# Patient Record
Sex: Female | Born: 1950 | Race: White | Hispanic: No | Smoking: Current every day smoker
Health system: Southern US, Community
[De-identification: ages and names within clinical notes are randomized; demographics above are authoritative.]

## PROBLEM LIST (undated history)

## (undated) DIAGNOSIS — J40 Bronchitis, not specified as acute or chronic: Secondary | ICD-10-CM

## (undated) HISTORY — PX: TONSILLECTOMY: SUR1361

---

## 2019-09-13 ENCOUNTER — Emergency Department: Payer: Medicare Other

## 2019-09-13 ENCOUNTER — Emergency Department
Admission: EM | Admit: 2019-09-13 | Discharge: 2019-09-14 | Disposition: A | Discharge status: Home / Self Care | Payer: Medicare Other | Attending: Student | Admitting: Student

## 2019-09-13 ENCOUNTER — Encounter: Payer: Self-pay | Admitting: Emergency Medicine

## 2019-09-13 ENCOUNTER — Other Ambulatory Visit: Payer: Self-pay

## 2019-09-13 DIAGNOSIS — R519 Headache, unspecified: Secondary | ICD-10-CM | POA: Diagnosis not present

## 2019-09-13 DIAGNOSIS — H538 Other visual disturbances: Secondary | ICD-10-CM | POA: Diagnosis not present

## 2019-09-13 DIAGNOSIS — F17200 Nicotine dependence, unspecified, uncomplicated: Secondary | ICD-10-CM | POA: Insufficient documentation

## 2019-09-13 DIAGNOSIS — H532 Diplopia: Secondary | ICD-10-CM | POA: Diagnosis not present

## 2019-09-13 LAB — SEDIMENTATION RATE: Sed Rate: 8 mm/hr (ref 0–30)

## 2019-09-13 LAB — CBC WITH DIFFERENTIAL/PLATELET
Abs Immature Granulocytes: 0.01 10*3/uL (ref 0.00–0.07)
Basophils Absolute: 0 10*3/uL (ref 0.0–0.1)
Basophils Relative: 0 %
Eosinophils Absolute: 0.1 10*3/uL (ref 0.0–0.5)
Eosinophils Relative: 2 %
HCT: 45.8 % (ref 36.0–46.0)
Hemoglobin: 15.1 g/dL — ABNORMAL HIGH (ref 12.0–15.0)
Immature Granulocytes: 0 %
Lymphocytes Relative: 15 %
Lymphs Abs: 0.9 10*3/uL (ref 0.7–4.0)
MCH: 26.6 pg (ref 26.0–34.0)
MCHC: 33 g/dL (ref 30.0–36.0)
MCV: 80.8 fL (ref 80.0–100.0)
Monocytes Absolute: 0.3 10*3/uL (ref 0.1–1.0)
Monocytes Relative: 6 %
Neutro Abs: 4.3 10*3/uL (ref 1.7–7.7)
Neutrophils Relative %: 77 %
Platelets: 78 10*3/uL — ABNORMAL LOW (ref 150–400)
RBC: 5.67 MIL/uL — ABNORMAL HIGH (ref 3.87–5.11)
RDW: 15.3 % (ref 11.5–15.5)
WBC: 5.7 10*3/uL (ref 4.0–10.5)
nRBC: 0 % (ref 0.0–0.2)

## 2019-09-13 LAB — COMPREHENSIVE METABOLIC PANEL
ALT: 17 U/L (ref 0–44)
AST: 29 U/L (ref 15–41)
Albumin: 3.9 g/dL (ref 3.5–5.0)
Alkaline Phosphatase: 77 U/L (ref 38–126)
Anion gap: 9 (ref 5–15)
BUN: 8 mg/dL (ref 8–23)
CO2: 27 mmol/L (ref 22–32)
Calcium: 9.1 mg/dL (ref 8.9–10.3)
Chloride: 103 mmol/L (ref 98–111)
Creatinine, Ser: 0.62 mg/dL (ref 0.44–1.00)
GFR calc Af Amer: >60 mL/min (ref >60)
GFR calc non Af Amer: >60 mL/min (ref >60)
Glucose, Bld: 99 mg/dL (ref 70–99)
Potassium: 3.7 mmol/L (ref 3.5–5.1)
Sodium: 139 mmol/L (ref 135–145)
Total Bilirubin: 1.6 mg/dL — ABNORMAL HIGH (ref 0.3–1.2)
Total Protein: 8 g/dL (ref 6.5–8.1)

## 2019-09-13 LAB — HEMOGLOBIN A1C
Hgb A1c MFr Bld: 5.7 % — ABNORMAL HIGH (ref 4.8–5.6)
Mean Plasma Glucose: 116.89 mg/dL

## 2019-09-13 LAB — C-REACTIVE PROTEIN: CRP: 0.6 mg/dL (ref <1.0)

## 2019-09-13 MED ORDER — IOHEXOL 300 MG/ML  SOLN
75.0000 mL | Freq: Once | INTRAMUSCULAR | Status: AC | PRN
  Administered 2019-09-13: 75 mL via INTRAVENOUS

## 2019-09-13 NOTE — ED Notes (Signed)
See triage note. Pt reports double-vision at times in L eye and HAs. Pt currently has glasses on. Pt states it has been a slow occurring process.

## 2019-09-13 NOTE — ED Provider Notes (Signed)
[facility] Emergency Department Provider Note  ____________________________________________   First MD Initiated Contact with Patient 09/13/19 2300     (approximate)  I have reviewed the triage vital signs and the nursing notes.  History  Chief Complaint Diplopia and Headache    HPI Joan Roman is a 69 y.o. female with no significant past medical history who presents for double vision.  Patient states symptoms seemed to start acutely, out of nowhere, about 3 weeks ago.  She was seen in the [facility] today and referred to the ED for further evaluation, including blood work and imaging.  Blurry vision and double vision seems isolated to the left eye.  When she covers her left eye and looks only with her right eye she reports improvement in her symptoms.  She describes generally blurry vision out of the left, but more so is bothered by double vision.  This seems to occur on left lateral gaze, she sees objects side-by-side.  However, she states this does not occur all the time, and not with all objects.  She was given a prism at the eye center which she states is improving her double vision.  She denies any eye pain.  Wears bifocal glasses at baseline.  Reports a mild associated headache, which she attributes to the blurred vision.  Headache improves when the double vision improves and with rest.  Denies any history of TIA or CVA.  Denies any history of VTE.  No associated weakness, numbness, tingling, or speech difficulties.  Denies any particular inciting event prior to the onset of her symptoms.   Past Medical Hx History reviewed. No pertinent past medical history.  Problem List There are no problems to display for this patient.   Past Surgical Hx History reviewed. No pertinent surgical history.  Medications Prior to Admission medications   Not on File    Allergies Patient has no known allergies.  Family Hx No family history on  file.  Social Hx Social History   Tobacco Use  . Smoking status: Current Every Day Smoker  . Smokeless tobacco: Never Used  Substance Use Topics  . Alcohol use: Not Currently  . Drug use: Not Currently     Review of Systems  Constitutional: Negative for fever. Negative for chills. Eyes: + for visual changes. ENT: Negative for sore throat. Cardiovascular: Negative for chest pain. Respiratory: Negative for shortness of breath. Gastrointestinal: Negative for nausea. Negative for vomiting.  Genitourinary: Negative for dysuria. Musculoskeletal: Negative for leg swelling. Skin: Negative for rash. Neurological: + for headaches.   Physical Exam  Vital Signs: ED Triage Vitals  Enc Vitals Group     BP 09/13/19 1706 134/66     Pulse Rate 09/13/19 1706 82     Resp 09/13/19 1706 16     Temp 09/13/19 1706 98.7 F (37.1 C)     Temp Source 09/13/19 1706 Oral     SpO2 09/13/19 1706 96 %     Weight 09/13/19 1710 260 lb (117.9 kg)     Height 09/13/19 1710 5\' 4"  (1.626 m)     Head Circumference --      Peak Flow --      Pain Score 09/13/19 1709 0     Pain Loc --      Pain Edu? --      Excl. in [place]? --     Constitutional: Alert and oriented. Well appearing. NAD.  Head: Normocephalic. Atraumatic. Eyes: Conjunctivae clear. Sclera anicteric. Pupils dilated  by ophthalmology prior to arrival - visual acuity deferred 2/2 this. Full EOMI without pain or discomfort, but does seem to have very subtle limitation on far left lateral gaze. On vertical upward gaze her R eye seems to drift medially, unsure if this is new or chronic. Visual fields in tact. No diplopia with prism in place. No nystagmus.  Nose: No masses or lesions. No congestion or rhinorrhea. Mouth/Throat: Wearing mask.  Neck: No stridor. Trachea midline.  Cardiovascular: Normal rate, regular rhythm. Extremities well perfused. Respiratory: Normal respiratory effort.  Lungs CTAB. Gastrointestinal: Soft. Non-distended. Non-tender.   Genitourinary: Deferred. Musculoskeletal: No lower extremity edema. No deformities. Neurologic:  Normal speech and language. No gross focal or lateralizing neurologic deficits are appreciated. CN II - XII in tact. No facial droop. UE and LE strength 5/5 and symmetric. SILT. Skin: Skin is warm, dry and intact. No rash noted. Psychiatric: Mood and affect are appropriate for situation.  EKG  N/A   Radiology  Personally reviewed available imaging myself.   CT head w/o IMPRESSION:  1. No acute abnormality.  2. Mild diffuse cerebral and cerebellar atrophy.  3. 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   CT orbits w/ contrast IMPRESSION:  1. No acute abnormality.  2. Mild chronic bilateral maxillary sinusitis.   MR Brain w/o IMPRESSION:  1. No acute intracranial abnormality.  2. Normal intracranial MRA.  3. Single focus of chronic microhemorrhage in the right parietal  white matter, nonspecific.   MRA head IMPRESSION:  1. No acute intracranial abnormality.  2. Normal intracranial MRA.  3. Single focus of chronic microhemorrhage in the right parietal  white matter, nonspecific.   MRV IMPRESSION:  Normal MRV of the brain.   Procedures  Procedure(s) performed (including critical care):  Procedures   Initial Impression / Assessment and Plan / MDM / ED Course  69 y.o. female who presents to the ED for diplopia, as above  Ddx: CN palsy, TIA, mass lesion, cranial nerve lesion, CVT  Work up initiated in triage including labs and CT head and orbits unremarkable. Will obtain MR imaging, incluing MR brain, MRA head, and MRV for further evaluation. Patient is agreeable.   MR imaging is all negative for any acute abnormalities.  Incidentally seen single focus of chronic microhemorrhage.  Updated patient on results, including incidental finding.  Discussed need for follow-up with PCP for this. She voices  understanding of this.  Otherwise, patient is stable for discharge with outpatient follow-up.  Given return precautions.    _______________________________   As part of my medical decision making I have reviewed available labs, radiology tests, reviewed old records/chart review.    Final Clinical Impression(s) / ED Diagnosis  Final diagnoses:  Double vision       Note:  This document was prepared using Dragon voice recognition software and may include unintentional dictation errors.   Miguel Aschoff., MD 09/14/19 [phone]

## 2019-09-13 NOTE — ED Triage Notes (Signed)
Pt to ED via POV from [facility] for new onset double vision and headache. Pt states that her double vision and headaches have been going on for 3 week. Pt states that she thought it was allergies at first but OTC allergies medications have not helped. Pt states that she does not have hx/o diabetes. Pt states that she does occasionally get headaches. Pt is in NAD.

## 2019-09-14 ENCOUNTER — Emergency Department: Payer: Medicare Other

## 2019-09-14 MED ORDER — GADOBUTROL 1 MMOL/ML IV SOLN
10.0000 mL | Freq: Once | INTRAVENOUS | Status: AC | PRN
  Administered 2019-09-14: 10 mL via INTRAVENOUS

## 2019-09-14 NOTE — Discharge Instructions (Addendum)
Thank you for letting us take care of you in the emergency department today.   Please follow up with: A primary care doctor to review your ER visit and follow up on your symptoms. If you do not have a regular doctor, information for a clinic is below. As we discussed, your MRI imaging was negative aside from a small focus of "chronic microhemorrhage" - while this is nothing we need to act on emergently, sometimes it can indicate things like undiagnosed high blood pressure, which should be looked at by your regular doctor The eye doctor   Please return to the ER for any new or worsening symptoms.

## 2021-01-12 ENCOUNTER — Emergency Department: Payer: Medicare Other

## 2021-01-12 ENCOUNTER — Other Ambulatory Visit: Payer: Self-pay

## 2021-01-12 ENCOUNTER — Encounter: Payer: Self-pay | Admitting: Internal Medicine

## 2021-01-12 ENCOUNTER — Inpatient Hospital Stay
Admission: EM | Admit: 2021-01-12 | Discharge: 2021-01-26 | DRG: 432 | Disposition: A | Discharge status: Other Acute Inpatient Hospital | Payer: Medicare Other | Attending: Internal Medicine | Admitting: Internal Medicine

## 2021-01-12 DIAGNOSIS — E66813 Obesity, class 3: Secondary | ICD-10-CM | POA: Diagnosis present

## 2021-01-12 DIAGNOSIS — E876 Hypokalemia: Secondary | ICD-10-CM | POA: Diagnosis present

## 2021-01-12 DIAGNOSIS — N390 Urinary tract infection, site not specified: Secondary | ICD-10-CM | POA: Diagnosis present

## 2021-01-12 DIAGNOSIS — F17210 Nicotine dependence, cigarettes, uncomplicated: Secondary | ICD-10-CM | POA: Diagnosis present

## 2021-01-12 DIAGNOSIS — R0602 Shortness of breath: Secondary | ICD-10-CM

## 2021-01-12 DIAGNOSIS — Z20822 Contact with and (suspected) exposure to covid-19: Secondary | ICD-10-CM | POA: Diagnosis present

## 2021-01-12 DIAGNOSIS — B961 Klebsiella pneumoniae [K. pneumoniae] as the cause of diseases classified elsewhere: Secondary | ICD-10-CM | POA: Diagnosis present

## 2021-01-12 DIAGNOSIS — J90 Pleural effusion, not elsewhere classified: Secondary | ICD-10-CM | POA: Diagnosis present

## 2021-01-12 DIAGNOSIS — K767 Hepatorenal syndrome: Secondary | ICD-10-CM | POA: Diagnosis present

## 2021-01-12 DIAGNOSIS — E559 Vitamin D deficiency, unspecified: Secondary | ICD-10-CM | POA: Diagnosis present

## 2021-01-12 DIAGNOSIS — I9589 Other hypotension: Secondary | ICD-10-CM | POA: Diagnosis not present

## 2021-01-12 DIAGNOSIS — E8809 Other disorders of plasma-protein metabolism, not elsewhere classified: Secondary | ICD-10-CM | POA: Diagnosis present

## 2021-01-12 DIAGNOSIS — D6959 Other secondary thrombocytopenia: Secondary | ICD-10-CM | POA: Diagnosis present

## 2021-01-12 DIAGNOSIS — T502X5A Adverse effect of carbonic-anhydrase inhibitors, benzothiadiazides and other diuretics, initial encounter: Secondary | ICD-10-CM | POA: Diagnosis present

## 2021-01-12 DIAGNOSIS — K7031 Alcoholic cirrhosis of liver with ascites: Secondary | ICD-10-CM | POA: Insufficient documentation

## 2021-01-12 DIAGNOSIS — K766 Portal hypertension: Secondary | ICD-10-CM | POA: Diagnosis present

## 2021-01-12 DIAGNOSIS — R188 Other ascites: Secondary | ICD-10-CM | POA: Diagnosis not present

## 2021-01-12 DIAGNOSIS — I5032 Chronic diastolic (congestive) heart failure: Secondary | ICD-10-CM | POA: Diagnosis present

## 2021-01-12 DIAGNOSIS — Z28310 Unvaccinated for covid-19: Secondary | ICD-10-CM

## 2021-01-12 DIAGNOSIS — Z79899 Other long term (current) drug therapy: Secondary | ICD-10-CM

## 2021-01-12 DIAGNOSIS — K7210 Chronic hepatic failure without coma: Secondary | ICD-10-CM | POA: Diagnosis present

## 2021-01-12 DIAGNOSIS — Z6841 Body Mass Index (BMI) 40.0 and over, adult: Secondary | ICD-10-CM

## 2021-01-12 DIAGNOSIS — K7460 Unspecified cirrhosis of liver: Secondary | ICD-10-CM | POA: Diagnosis not present

## 2021-01-12 DIAGNOSIS — E538 Deficiency of other specified B group vitamins: Secondary | ICD-10-CM | POA: Diagnosis present

## 2021-01-12 DIAGNOSIS — D696 Thrombocytopenia, unspecified: Secondary | ICD-10-CM | POA: Diagnosis present

## 2021-01-12 HISTORY — DX: Bronchitis, not specified as acute or chronic: J40

## 2021-01-12 LAB — BASIC METABOLIC PANEL
Anion gap: 15 (ref 5–15)
BUN: 18 mg/dL (ref 8–23)
CO2: 23 mmol/L (ref 22–32)
Calcium: 8.9 mg/dL (ref 8.9–10.3)
Chloride: 96 mmol/L — ABNORMAL LOW (ref 98–111)
Creatinine, Ser: 1.1 mg/dL — ABNORMAL HIGH (ref 0.44–1.00)
GFR, Estimated: 55 mL/min — ABNORMAL LOW (ref >60)
Glucose, Bld: 67 mg/dL — ABNORMAL LOW (ref 70–99)
Potassium: 4.7 mmol/L (ref 3.5–5.1)
Sodium: 134 mmol/L — ABNORMAL LOW (ref 135–145)

## 2021-01-12 LAB — CBG MONITORING, ED: Glucose-Capillary: 89 mg/dL (ref 70–99)

## 2021-01-12 LAB — COMPREHENSIVE METABOLIC PANEL
ALT: 9 U/L (ref 0–44)
AST: 32 U/L (ref 15–41)
Albumin: 2.9 g/dL — ABNORMAL LOW (ref 3.5–5.0)
Alkaline Phosphatase: 42 U/L (ref 38–126)
Anion gap: 16 — ABNORMAL HIGH (ref 5–15)
BUN: 18 mg/dL (ref 8–23)
CO2: 22 mmol/L (ref 22–32)
Calcium: 8.8 mg/dL — ABNORMAL LOW (ref 8.9–10.3)
Chloride: 97 mmol/L — ABNORMAL LOW (ref 98–111)
Creatinine, Ser: 0.96 mg/dL (ref 0.44–1.00)
GFR, Estimated: >60 mL/min (ref >60)
Glucose, Bld: 63 mg/dL — ABNORMAL LOW (ref 70–99)
Potassium: 4.6 mmol/L (ref 3.5–5.1)
Sodium: 135 mmol/L (ref 135–145)
Total Bilirubin: 2.7 mg/dL — ABNORMAL HIGH (ref 0.3–1.2)
Total Protein: 7.1 g/dL (ref 6.5–8.1)

## 2021-01-12 LAB — BODY FLUID CELL COUNT WITH DIFFERENTIAL
Eos, Fluid: 0 %
Lymphs, Fluid: 72 %
Monocyte-Macrophage-Serous Fluid: 20 %
Neutrophil Count, Fluid: 8 %
Other Cells, Fluid: 0 %
Total Nucleated Cell Count, Fluid: 111 cu mm

## 2021-01-12 LAB — URINALYSIS, COMPLETE (UACMP) WITH MICROSCOPIC
Glucose, UA: NEGATIVE mg/dL
Ketones, ur: 20 mg/dL — AB
Nitrite: NEGATIVE
Protein, ur: 100 mg/dL — AB
Specific Gravity, Urine: 1.027 (ref 1.005–1.030)
WBC, UA: >50 WBC/hpf — ABNORMAL HIGH (ref 0–5)
pH: 6 (ref 5.0–8.0)

## 2021-01-12 LAB — CBC
HCT: 44.4 % (ref 36.0–46.0)
Hemoglobin: 14.2 g/dL (ref 12.0–15.0)
MCH: 26 pg (ref 26.0–34.0)
MCHC: 32 g/dL (ref 30.0–36.0)
MCV: 81.3 fL (ref 80.0–100.0)
Platelets: 89 10*3/uL — ABNORMAL LOW (ref 150–400)
RBC: 5.46 MIL/uL — ABNORMAL HIGH (ref 3.87–5.11)
RDW: 17.9 % — ABNORMAL HIGH (ref 11.5–15.5)
WBC: 6 10*3/uL (ref 4.0–10.5)
nRBC: 0 % (ref 0.0–0.2)

## 2021-01-12 LAB — RESP PANEL BY RT-PCR (FLU A&B, COVID) ARPGX2
Influenza A by PCR: NEGATIVE
Influenza B by PCR: NEGATIVE
SARS Coronavirus 2 by RT PCR: NEGATIVE

## 2021-01-12 LAB — GLUCOSE, CAPILLARY: Glucose-Capillary: 110 mg/dL — ABNORMAL HIGH (ref 70–99)

## 2021-01-12 LAB — HEMOGLOBIN A1C
Hgb A1c MFr Bld: 4.4 % — ABNORMAL LOW (ref 4.8–5.6)
Mean Plasma Glucose: 79.58 mg/dL

## 2021-01-12 LAB — TSH: TSH: 2.868 u[IU]/mL (ref 0.350–4.500)

## 2021-01-12 MED ORDER — FUROSEMIDE 10 MG/ML IJ SOLN
20.0000 mg | Freq: Once | INTRAMUSCULAR | Status: AC
  Administered 2021-01-12: 20 mg via INTRAVENOUS
  Filled 2021-01-12: qty 4

## 2021-01-12 MED ORDER — INSULIN ASPART 100 UNIT/ML IJ SOLN
0.0000 [IU] | Freq: Three times a day (TID) | INTRAMUSCULAR | Status: DC
  Administered 2021-01-13: 1 [IU] via SUBCUTANEOUS
  Filled 2021-01-12: qty 1

## 2021-01-12 MED ORDER — INSULIN ASPART 100 UNIT/ML IJ SOLN: 0.0000 [IU] | Freq: Every day | INTRAMUSCULAR | Status: DC

## 2021-01-12 MED ORDER — HYDRALAZINE HCL 10 MG PO TABS
10.0000 mg | ORAL_TABLET | Freq: Four times a day (QID) | ORAL | Status: AC | PRN
  Filled 2021-01-12: qty 1

## 2021-01-12 MED ORDER — ONDANSETRON HCL 4 MG PO TABS
4.0000 mg | ORAL_TABLET | Freq: Four times a day (QID) | ORAL | Status: DC | PRN
  Administered 2021-01-14 – 2021-01-23 (×3): 4 mg via ORAL
  Filled 2021-01-12 (×3): qty 1

## 2021-01-12 MED ORDER — ONDANSETRON HCL 4 MG/2ML IJ SOLN
4.0000 mg | Freq: Four times a day (QID) | INTRAMUSCULAR | Status: DC | PRN
  Administered 2021-01-13: 4 mg via INTRAVENOUS
  Filled 2021-01-12: qty 2

## 2021-01-12 MED ORDER — ENOXAPARIN SODIUM 40 MG/0.4ML IJ SOSY: 40.0000 mg | PREFILLED_SYRINGE | Freq: Every day | INTRAMUSCULAR | Status: DC

## 2021-01-12 MED ORDER — DEXTROSE 50 % IV SOLN: 1.0000 | INTRAVENOUS | Status: AC | PRN

## 2021-01-12 MED ORDER — IBUPROFEN 400 MG PO TABS
400.0000 mg | ORAL_TABLET | Freq: Four times a day (QID) | ORAL | Status: DC | PRN
Start: 2021-01-12 — End: 2021-01-17

## 2021-01-12 MED ORDER — ALBUMIN HUMAN 25 % IV SOLN
12.5000 g | INTRAVENOUS | Status: DC | PRN
  Filled 2021-01-12 (×2): qty 50

## 2021-01-12 NOTE — ED Notes (Signed)
Patient transported to CT 

## 2021-01-12 NOTE — ED Triage Notes (Signed)
Pt reports that she has been weak for the last three days, she has not been able to get up out of her chair, has not been able to eat or drink for those days either. She has stomach distension. She has never had fluid pulled of of her abdomen. She reports that her stomach feels tight.

## 2021-01-12 NOTE — H&P (Signed)
History and Physical   Joan Roman:[id number] DOB: September 03, 1950 DOA: 01/12/2021  PCP: Pcp, No  Patient coming from: Home via EMS  I have personally briefly reviewed patient's old medical records in [facility] EMR.  Chief Concern: Weakness and abdominal distention  HPI: Joan Roman is a 71 y.o. female with no prior medical diagnosis presents to the ED  She noticed bilateral lower extremity swelling that started 1.5 months ago. She denies leg pain. She states her knees would hurt if she's on her knees and that would hurt if she put excessive weight on her knees, to crawl on her knees to try to get up.   She reports her abdominal swelling started max one month ago. She states that she has a 'belly anyways' so she did realize anything was off. She noticeed tightening of her bra about 2-3 weeks ago.   She endorses subjective fever, however she didn't take her temperature. She endorses baseline cough, however noticed that the cough is assocaited with abdominal discomfort. She denies nausea and vomiting. She denies chest pain, dysuria. She endorses diarrhea for several weeks, with increased mucus, several times per day.   She reports feeling faint this AM.  She reports increased weakness and was unable to get up from sitting or laying position, prompting her to call EMS for transport to the emergency department for further evaluation.  She denies changes to her diet, medications. She reports she has not been to a doctor for more than 10 years.  She reports she has never been diagnosed with anything other than asthma/bronchitis.  She reports she has not been prescribed any medications.  Past medical history: Bronchitis when she was younger  Surgery history: tonsillectomy at age 63  Family history: no prior family history of liver disease that patient is aware of  Social history: She lives by herself at home. She quit smoking about 1 month ago and smoked about 0.5-1 ppd, she started  at age 70. She reports rare etoh consumption. She reports her last etoh drink was last December 2021. She reports she has never been a heavy etoh user. She denies recreational drug use. 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   Vaccination history: She is not vaccinated for covid 19.  She does not know if she is ever been vaccinated for hepatitis B.  ROS: Constitutional: no weight change, no fever ENT/Mouth: no sore throat, no rhinorrhea Eyes: no eye pain, no vision changes Cardiovascular: no chest pain, + dyspnea,  no edema, no palpitations Respiratory: no cough, no sputum, no wheezing Gastrointestinal: no nausea, no vomiting, no diarrhea, no constipation Genitourinary: no urinary incontinence, no dysuria, no hematuria Musculoskeletal: no arthralgias, no myalgias Skin: no skin lesions, no pruritus, Neuro: + weakness, no loss of consciousness, no syncope Psych: no anxiety, no depression, + decrease appetite Heme/Lymph: no bruising, no bleeding  ED Course: Discussed with emergency medicine provider, patient requiring hospitalization for acute abdominal ascites with no prior history of liver cirrhosis.  Vitals in the emergency department was remarkable for temperature 97.8, respiration rate of 20, heart rate 93, blood pressure 129/77, SPO2 of 95% on room air.  Labs in the emergency department was remarkable for sodium 134, potassium 4.7, chloride 96, bicarb 23, BUN 18, serum creatinine of 1.10, nonfasting blood glucose 67  Emergency medicine provider ordered Lasix 20 mg IV once.  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  Approximately 2.5 L were removed at bedside, straw-colored, yellow, clear and negative for purulence - A.m. team to consult IR for further drainage if indicated - Albumin 25%, 12.5 g IV as needed for MAP less than 65, 2 doses ordered - As patient does not have fever and/or leukocytosis, low clinical suspicion for SBP at this time - Therefore I have deferred antibiotic initiation pending body fluid cell count and fluid culture  # Pleural effusion-etiology work-up in progress, differentials include chronic liver cirrhosis versus heart failure exacerbation - Please see above for work-up  # Thrombocytopenia-I suspect this is secondary to liver cirrhosis # Elevated T bili - LFTs in the a.m. DVT prophylaxis-I scheduled Lovenox subcutaneous DVT prophylaxis for 01/13/2021 at 10 PM - However pharmacy wanted this to be discontinued - A.m. team to evaluate daily a.m. platelets and if improved, would recommend initiating pharmacologic DVT when it is appropriate  # Low albumin-I suspect this is secondary to chronic liver failure - LFTs in the a.m.  # Regular health maintenance-patient has never had a colonoscopy, mammogram - TOC consulted for PCP need  Chart reviewed.   DVT prophylaxis: TED hose Code Status: full code  Diet: Heart healthy Family Communication: None Disposition Plan: Pending clinical course Consults called: None at this time Admission status: MedSurg, observation, telemetry  Past Medical History:  Diagnosis Date   Bronchitis    Past Surgical History:  Procedure Laterality Date   TONSILLECTOMY     Social History:  reports that she has been smoking. She has never used  smokeless tobacco. She reports that she does not currently use alcohol. She reports that she does not currently use drugs.  No Known Allergies History reviewed. No pertinent family history. Family history: Family history reviewed and not pertinent  Prior to Admission medications   Not on File   Physical Exam: Vitals:   01/12/21 1650 01/12/21 1800 01/12/21 1830 01/12/21 1900  BP: 117/71 121/84 123/77 134/85  Pulse: 88 93 85 92  Resp: 18 19  17   Temp:    98 F (36.7 C)  TempSrc:    Oral  SpO2: 98% 95% 98% 96%  Weight:      Height:       Constitutional: appears age-appropriate, NAD, calm, comfortable Eyes: PERRL, lids and conjunctivae normal ENMT: Mucous membranes are moist. Posterior pharynx clear of any exudate or lesions. Age-appropriate dentition. Hearing appropriate Neck: normal, supple, no masses, no thyromegaly Respiratory: clear to auscultation bilaterally, no wheezing, no crackles. Normal respiratory effort. No accessory muscle use.  Cardiovascular: Regular rate and rhythm, no murmurs / rubs / gallops.  Bilateral 2+ to 3+ pitting edema of the lower extremities. 2+ pedal pulses. No carotid bruits.  Abdomen: Distended abdomen, no tenderness, no masses palpated, no hepatosplenomegaly. Bowel sounds positive.  Musculoskeletal: no clubbing / cyanosis. No joint deformity upper and lower extremities. Good ROM, no contractures, no atrophy. Normal muscle tone.  Skin: no rashes, lesions,  ulcers. No induration Neurologic: Sensation intact. Strength 5/5 in all 4.  Psychiatric: Normal judgment and insight. Alert and oriented x 3. Normal mood.   EKG: independently reviewed, showing sinus rhythm with rate of 93, QTc 455  Chest x-ray on Admission: I personally reviewed and I agree with radiologist reading as below.  CT Abdomen Pelvis Wo Contrast  Result Date: 01/12/2021 CLINICAL DATA:  Abdominal distension EXAM: CT ABDOMEN AND PELVIS WITHOUT CONTRAST TECHNIQUE: Multidetector CT imaging  of the abdomen and pelvis was performed following the standard protocol without IV contrast. COMPARISON:  None. FINDINGS: Lower chest: Lung bases demonstrate small right-sided pleural effusion. Atelectasis at the right base. Normal cardiac size. Small cardio phrenic lymph nodes. Small distal esophageal hiatal hernia. Hepatobiliary: Cirrhotic morphology of the liver. Multiple calcified gallstones. Subcentimeter hypodensity within the liver too small to further characterize. No biliary dilatation. Recanalized paraumbilical vein. Pancreas: Unremarkable. No pancreatic ductal dilatation or surrounding inflammatory changes. Spleen: Enlarged measuring 15.5 cm craniocaudad. Adrenals/Urinary Tract: Adrenal glands are normal. Multiple bilateral kidney stones without hydronephrosis. The bladder is difficult to see due to ascites. Stomach/Bowel: Centralization of the bowel. No bowel distension. No acute bowel wall thickening. Negative appendix. Vascular/Lymphatic: Mild aortic atherosclerosis. No aneurysm. No suspicious nodes. Tortuous portosystemic collaterals in the left upper quadrant. Reproductive: Uterus and bilateral adnexa are unremarkable. Other: Large volume of abdominopelvic ascites.  No free air. Musculoskeletal: No acute or suspicious osseous abnormality IMPRESSION: 1. Liver cirrhosis with portal hypertension. Large quantity of abdominopelvic ascites. 2. No evidence for bowel obstruction 3. Trace right-sided pleural effusion 4. Gallstones 5. Nonobstructing kidney stones Electronically Signed   By: Jasmine Pang M.D.   On: 01/12/2021 17:31    Labs on Admission: I have personally reviewed following labs  CBC: Recent Labs  Lab 01/12/21 1357  WBC 6.0  HGB 14.2  HCT 44.4  MCV 81.3  PLT 89*   Basic Metabolic Panel: Recent Labs  Lab 01/12/21 1357  NA 135  134*  K 4.6  4.7  CL 97*  96*  CO2 22  23  GLUCOSE 63*  67*  BUN 18  18  CREATININE 0.96  1.10*  CALCIUM 8.8*  8.9   GFR: Estimated  Creatinine Clearance: 59.7 mL/min (A) (by C-G formula based on SCr of 1.1 mg/dL (H)).  Liver Function Tests: Recent Labs  Lab 01/12/21 1357  AST 32  ALT 9  ALKPHOS 42  BILITOT 2.7*  PROT 7.1  ALBUMIN 2.9*   Dr. Sedalia Muta [facility]  If 7PM-7AM, please contact overnight-coverage provider If 7AM-7PM, please contact day coverage provider [email or web address]  01/12/2021, 8:44 PM

## 2021-01-12 NOTE — ED Triage Notes (Signed)
Pt in via EMS from home with c/o weakness. Pt with abdominal distention. Pt has not been able to get our of her chair in a few days. Pt does not have a primary MD.

## 2021-01-12 NOTE — ED Notes (Signed)
Pt hypoglycemic on BMP. Pt A&Ox4. Pt provided with juice and meal tray

## 2021-01-13 ENCOUNTER — Inpatient Hospital Stay: Payer: Medicare Other

## 2021-01-13 DIAGNOSIS — T502X5A Adverse effect of carbonic-anhydrase inhibitors, benzothiadiazides and other diuretics, initial encounter: Secondary | ICD-10-CM | POA: Diagnosis present

## 2021-01-13 DIAGNOSIS — N390 Urinary tract infection, site not specified: Secondary | ICD-10-CM | POA: Diagnosis present

## 2021-01-13 DIAGNOSIS — D696 Thrombocytopenia, unspecified: Secondary | ICD-10-CM | POA: Diagnosis not present

## 2021-01-13 DIAGNOSIS — B961 Klebsiella pneumoniae [K. pneumoniae] as the cause of diseases classified elsewhere: Secondary | ICD-10-CM | POA: Diagnosis present

## 2021-01-13 DIAGNOSIS — E538 Deficiency of other specified B group vitamins: Secondary | ICD-10-CM | POA: Diagnosis present

## 2021-01-13 DIAGNOSIS — Z79899 Other long term (current) drug therapy: Secondary | ICD-10-CM | POA: Diagnosis not present

## 2021-01-13 DIAGNOSIS — E559 Vitamin D deficiency, unspecified: Secondary | ICD-10-CM | POA: Diagnosis present

## 2021-01-13 DIAGNOSIS — I9589 Other hypotension: Secondary | ICD-10-CM | POA: Diagnosis not present

## 2021-01-13 DIAGNOSIS — Z28310 Unvaccinated for covid-19: Secondary | ICD-10-CM | POA: Diagnosis not present

## 2021-01-13 DIAGNOSIS — K7210 Chronic hepatic failure without coma: Secondary | ICD-10-CM | POA: Diagnosis present

## 2021-01-13 DIAGNOSIS — Z6841 Body Mass Index (BMI) 40.0 and over, adult: Secondary | ICD-10-CM | POA: Diagnosis not present

## 2021-01-13 DIAGNOSIS — I5032 Chronic diastolic (congestive) heart failure: Secondary | ICD-10-CM | POA: Diagnosis present

## 2021-01-13 DIAGNOSIS — K766 Portal hypertension: Secondary | ICD-10-CM | POA: Diagnosis present

## 2021-01-13 DIAGNOSIS — J90 Pleural effusion, not elsewhere classified: Secondary | ICD-10-CM | POA: Diagnosis not present

## 2021-01-13 DIAGNOSIS — E8809 Other disorders of plasma-protein metabolism, not elsewhere classified: Secondary | ICD-10-CM | POA: Diagnosis present

## 2021-01-13 DIAGNOSIS — E876 Hypokalemia: Secondary | ICD-10-CM | POA: Diagnosis present

## 2021-01-13 DIAGNOSIS — K767 Hepatorenal syndrome: Secondary | ICD-10-CM | POA: Diagnosis present

## 2021-01-13 DIAGNOSIS — D6959 Other secondary thrombocytopenia: Secondary | ICD-10-CM | POA: Diagnosis present

## 2021-01-13 DIAGNOSIS — K7460 Unspecified cirrhosis of liver: Secondary | ICD-10-CM | POA: Diagnosis present

## 2021-01-13 DIAGNOSIS — Z20822 Contact with and (suspected) exposure to covid-19: Secondary | ICD-10-CM | POA: Diagnosis present

## 2021-01-13 DIAGNOSIS — R188 Other ascites: Secondary | ICD-10-CM | POA: Diagnosis present

## 2021-01-13 DIAGNOSIS — F17210 Nicotine dependence, cigarettes, uncomplicated: Secondary | ICD-10-CM | POA: Diagnosis present

## 2021-01-13 LAB — CBC
HCT: 38.8 % (ref 36.0–46.0)
Hemoglobin: 12.8 g/dL (ref 12.0–15.0)
MCH: 26.4 pg (ref 26.0–34.0)
MCHC: 33 g/dL (ref 30.0–36.0)
MCV: 80 fL (ref 80.0–100.0)
Platelets: 80 10*3/uL — ABNORMAL LOW (ref 150–400)
RBC: 4.85 MIL/uL (ref 3.87–5.11)
RDW: 17.7 % — ABNORMAL HIGH (ref 11.5–15.5)
WBC: 4.2 10*3/uL (ref 4.0–10.5)
nRBC: 0 % (ref 0.0–0.2)

## 2021-01-13 LAB — HEPATITIS PANEL, ACUTE
HCV Ab: NONREACTIVE
Hep A IgM: NONREACTIVE
Hep B C IgM: NONREACTIVE
Hepatitis B Surface Ag: NONREACTIVE

## 2021-01-13 LAB — BASIC METABOLIC PANEL
Anion gap: 11 (ref 5–15)
BUN: 19 mg/dL (ref 8–23)
CO2: 27 mmol/L (ref 22–32)
Calcium: 8.4 mg/dL — ABNORMAL LOW (ref 8.9–10.3)
Chloride: 97 mmol/L — ABNORMAL LOW (ref 98–111)
Creatinine, Ser: 0.93 mg/dL (ref 0.44–1.00)
GFR, Estimated: >60 mL/min (ref >60)
Glucose, Bld: 84 mg/dL (ref 70–99)
Potassium: 4.2 mmol/L (ref 3.5–5.1)
Sodium: 135 mmol/L (ref 135–145)

## 2021-01-13 LAB — PHOSPHORUS: Phosphorus: 2.7 mg/dL (ref 2.5–4.6)

## 2021-01-13 LAB — FOLATE: Folate: 4.2 ng/mL — ABNORMAL LOW (ref >5.9)

## 2021-01-13 LAB — HEPATIC FUNCTION PANEL
ALT: 9 U/L (ref 0–44)
AST: 25 U/L (ref 15–41)
Albumin: 2.6 g/dL — ABNORMAL LOW (ref 3.5–5.0)
Alkaline Phosphatase: 41 U/L (ref 38–126)
Bilirubin, Direct: 0.5 mg/dL — ABNORMAL HIGH (ref 0.0–0.2)
Indirect Bilirubin: 1.4 mg/dL — ABNORMAL HIGH (ref 0.3–0.9)
Total Bilirubin: 1.9 mg/dL — ABNORMAL HIGH (ref 0.3–1.2)
Total Protein: 6 g/dL — ABNORMAL LOW (ref 6.5–8.1)

## 2021-01-13 LAB — HIV ANTIBODY (ROUTINE TESTING W REFLEX): HIV Screen 4th Generation wRfx: NONREACTIVE

## 2021-01-13 LAB — IRON AND TIBC
Iron: 37 ug/dL (ref 28–170)
Saturation Ratios: 16 % (ref 10.4–31.8)
TIBC: 238 ug/dL — ABNORMAL LOW (ref 250–450)
UIBC: 201 ug/dL

## 2021-01-13 LAB — AMMONIA: Ammonia: 21 umol/L (ref 9–35)

## 2021-01-13 LAB — VITAMIN D 25 HYDROXY (VIT D DEFICIENCY, FRACTURES): Vit D, 25-Hydroxy: 9.47 ng/mL — ABNORMAL LOW (ref 30–100)

## 2021-01-13 LAB — HEPATITIS B CORE ANTIBODY, TOTAL: Hep B Core Total Ab: NONREACTIVE

## 2021-01-13 LAB — MAGNESIUM: Magnesium: 1.9 mg/dL (ref 1.7–2.4)

## 2021-01-13 LAB — GLUCOSE, CAPILLARY
Glucose-Capillary: 73 mg/dL (ref 70–99)
Glucose-Capillary: 112 mg/dL — ABNORMAL HIGH (ref 70–99)
Glucose-Capillary: 122 mg/dL — ABNORMAL HIGH (ref 70–99)
Glucose-Capillary: 115 mg/dL — ABNORMAL HIGH (ref 70–99)

## 2021-01-13 LAB — VITAMIN B12: Vitamin B-12: 337 pg/mL (ref 180–914)

## 2021-01-13 LAB — HEPATITIS C ANTIBODY: HCV Ab: NONREACTIVE

## 2021-01-13 MED ORDER — SIMETHICONE 80 MG PO CHEW
80.0000 mg | CHEWABLE_TABLET | Freq: Four times a day (QID) | ORAL | Status: DC | PRN
  Administered 2021-01-13: 20:00:00 80 mg via ORAL
  Filled 2021-01-13 (×3): qty 1

## 2021-01-13 MED ORDER — FOLIC ACID 1 MG PO TABS
1.0000 mg | ORAL_TABLET | Freq: Every day | ORAL | Status: DC
  Administered 2021-01-13 – 2021-01-26 (×14): 1 mg via ORAL
  Filled 2021-01-13 (×16): qty 1

## 2021-01-13 MED ORDER — MIDODRINE HCL 5 MG PO TABS
5.0000 mg | ORAL_TABLET | Freq: Three times a day (TID) | ORAL | Status: DC
  Administered 2021-01-13 – 2021-01-16 (×8): 5 mg via ORAL
  Filled 2021-01-13 (×9): qty 1

## 2021-01-13 MED ORDER — FUROSEMIDE 10 MG/ML IJ SOLN
8.0000 mg/h | INTRAVENOUS | Status: DC
  Administered 2021-01-13 – 2021-01-16 (×4): 8 mg/h via INTRAVENOUS
  Filled 2021-01-13 (×4): qty 20

## 2021-01-13 NOTE — ED Provider Notes (Signed)
[facility] Emergency Department Provider Note   ____________________________________________   Event Date/Time   First MD Initiated Contact with Patient 01/12/21 1644     (approximate)  I have reviewed the triage vital signs and the nursing notes.   HISTORY  Chief Complaint Weakness    HPI Joan Roman is a 70 y.o. female who presents for generalized weakness and abdominal distention  LOCATION: Abdomen/lower extremities DURATION: Worsening over the last month TIMING: Worsening SEVERITY: Severe QUALITY: Generalized weakness and abdominal distention CONTEXT: Patient does not hold any known history of liver disease but has had swelling on her abdomen that is caused her to become very weak in her lower extremities MODIFYING FACTORS: States that sitting for too long causes her lower extremities to go numb ASSOCIATED SYMPTOMS: Nausea and abdominal pain   Per medical record review, patient only has history of bronchitis          Past Medical History:  Diagnosis Date   Bronchitis     Patient Active Problem List   Diagnosis Date Noted   Ascites of liver 01/13/2021   Abdominal ascites 01/12/2021   Obesity, Class III, BMI 40-49.9 (morbid obesity) (HCC) 01/12/2021   Pleural effusion 01/12/2021   Thrombocytopenia (HCC) 01/12/2021   Liver cirrhosis (HCC) 01/12/2021    Past Surgical History:  Procedure Laterality Date   TONSILLECTOMY      Prior to Admission medications   Not on File    Allergies Patient has no known allergies.  History reviewed. No pertinent family history.  Social History Social History   Tobacco Use   Smoking status: Every Day    Types: Cigarettes   Smokeless tobacco: Never  Substance Use Topics   Alcohol use: Not Currently   Drug use: Never    Review of Systems Constitutional: No fever/chills Eyes: No visual changes. ENT: No sore throat. Cardiovascular: Denies chest pain. Respiratory: Denies  shortness of breath. Gastrointestinal: Endorses generalized abdominal pain and distention.  No nausea, no vomiting.  No diarrhea. Genitourinary: Negative for dysuria. Musculoskeletal: Negative for acute arthralgias Skin: Negative for rash. Neurological: Positive for bilateral lower extremity weakness.  Negative for headaches, numbness/paresthesias in any extremity Psychiatric: Negative for suicidal ideation/homicidal ideation   ____________________________________________   PHYSICAL EXAM:  VITAL SIGNS: ED Triage Vitals  Enc Vitals Group     BP 01/12/21 1404 129/77     Pulse Rate 01/12/21 1404 93     Resp 01/12/21 1404 20     Temp 01/12/21 1404 97.8 F (36.6 C)     Temp Source 01/12/21 1404 Oral     SpO2 01/12/21 1404 95 %     Weight 01/12/21 1353 245 lb (111.1 kg)     Height 01/12/21 1353 5\' 4"  (1.626 m)     Head Circumference --      Peak Flow --      Pain Score 01/12/21 1353 0     Pain Loc --      Pain Edu? --      Excl. in GC? --    Constitutional: Alert and oriented. Well appearing and in no acute distress. Eyes: Conjunctivae are normal. PERRL. Head: Atraumatic. Nose: No congestion/rhinnorhea. Mouth/Throat: Mucous membranes are moist. Neck: No stridor Cardiovascular: Grossly normal heart sounds.  Good peripheral circulation. Respiratory: Normal respiratory effort.  No retractions. Gastrointestinal: Extremely distended with positive fluid wave and large volume free fluid on bedside ultrasound Musculoskeletal: No obvious deformities Neurologic:  Normal speech and language. No gross focal neurologic  deficits are appreciated. Skin:  Skin is warm and dry.  Periumbilical telangiectasias and angiomata Psychiatric: Mood and affect are normal. Speech and behavior are normal.  ____________________________________________   LABS (all labs ordered are listed, but only abnormal results are displayed)  Labs Reviewed  BASIC METABOLIC PANEL - Abnormal; Notable for the  following components:      Result Value   Sodium 134 (*)    Chloride 96 (*)    Glucose, Bld 67 (*)    Creatinine, Ser 1.10 (*)    GFR, Estimated 55 (*)    All other components within normal limits  CBC - Abnormal; Notable for the following components:   RBC 5.46 (*)    RDW 17.9 (*)    Platelets 89 (*)    All other components within normal limits  URINALYSIS, COMPLETE (UACMP) WITH MICROSCOPIC - Abnormal; Notable for the following components:   Color, Urine AMBER (*)    APPearance CLOUDY (*)    Hgb urine dipstick MODERATE (*)    Bilirubin Urine SMALL (*)    Ketones, ur 20 (*)    Protein, ur 100 (*)    Leukocytes,Ua LARGE (*)    WBC, UA >50 (*)    Bacteria, UA MANY (*)    All other components within normal limits  COMPREHENSIVE METABOLIC PANEL - Abnormal; Notable for the following components:   Chloride 97 (*)    Glucose, Bld 63 (*)    Calcium 8.8 (*)    Albumin 2.9 (*)    Total Bilirubin 2.7 (*)    Anion gap 16 (*)    All other components within normal limits  BASIC METABOLIC PANEL - Abnormal; Notable for the following components:   Chloride 97 (*)    Calcium 8.4 (*)    All other components within normal limits  CBC - Abnormal; Notable for the following components:   RDW 17.7 (*)    Platelets 80 (*)    All other components within normal limits  BODY FLUID CELL COUNT WITH DIFFERENTIAL - Abnormal; Notable for the following components:   Color, Fluid YELLOW (*)    All other components within normal limits  HEPATIC FUNCTION PANEL - Abnormal; Notable for the following components:   Total Protein 6.0 (*)    Albumin 2.6 (*)    Total Bilirubin 1.9 (*)    Bilirubin, Direct 0.5 (*)    Indirect Bilirubin 1.4 (*)    All other components within normal limits  HEMOGLOBIN A1C - Abnormal; Notable for the following components:   Hgb A1c MFr Bld 4.4 (*)    All other components within normal limits  GLUCOSE, CAPILLARY - Abnormal; Notable for the following components:    Glucose-Capillary 110 (*)    All other components within normal limits  IRON AND TIBC - Abnormal; Notable for the following components:   TIBC 238 (*)    All other components within normal limits  FOLATE - Abnormal; Notable for the following components:   Folate 4.2 (*)    All other components within normal limits  GLUCOSE, CAPILLARY - Abnormal; Notable for the following components:   Glucose-Capillary 112 (*)    All other components within normal limits  RESP PANEL BY RT-PCR (FLU A&B, COVID) ARPGX2  BODY FLUID CULTURE W GRAM STAIN  URINE CULTURE  HIV ANTIBODY (ROUTINE TESTING W REFLEX)  TSH  HEPATITIS PANEL, ACUTE  HEPATITIS C ANTIBODY  HEPATITIS B CORE ANTIBODY, TOTAL  GLUCOSE, CAPILLARY  MAGNESIUM  PHOSPHORUS  AMMONIA  HCV  RNA QUANT  VITAMIN B12  VITAMIN D 25 HYDROXY (VIT D DEFICIENCY, FRACTURES)  CBG MONITORING, ED  CYTOLOGY - NON PAP   ____________________________________________  EKG  ED ECG REPORT I, Merwyn Katos, the attending physician, personally viewed and interpreted this ECG.  Date: 01/13/2021 EKG Time: 1353 Rate: 93 Rhythm: normal sinus rhythm QRS Axis: normal Intervals: normal ST/T Wave abnormalities: normal Narrative Interpretation: no evidence of acute ischemia  ____________________________________________  RADIOLOGY  ED MD interpretation: CT of the abdomen and pelvis without contrast shows liver cirrhosis with portal hypertension and a large amount of abdominal pelvic ascites  Official radiology report(s): CT Abdomen Pelvis Wo Contrast  Result Date: 01/12/2021 CLINICAL DATA:  Abdominal distension EXAM: CT ABDOMEN AND PELVIS WITHOUT CONTRAST TECHNIQUE: Multidetector CT imaging of the abdomen and pelvis was performed following the standard protocol without IV contrast. COMPARISON:  None. FINDINGS: Lower chest: Lung bases demonstrate small right-sided pleural effusion. Atelectasis at the right base. Normal cardiac size. Small cardio phrenic lymph  nodes. Small distal esophageal hiatal hernia. Hepatobiliary: Cirrhotic morphology of the liver. Multiple calcified gallstones. Subcentimeter hypodensity within the liver too small to further characterize. No biliary dilatation. Recanalized paraumbilical vein. Pancreas: Unremarkable. No pancreatic ductal dilatation or surrounding inflammatory changes. Spleen: Enlarged measuring 15.5 cm craniocaudad. Adrenals/Urinary Tract: Adrenal glands are normal. Multiple bilateral kidney stones without hydronephrosis. The bladder is difficult to see due to ascites. Stomach/Bowel: Centralization of the bowel. No bowel distension. No acute bowel wall thickening. Negative appendix. Vascular/Lymphatic: Mild aortic atherosclerosis. No aneurysm. No suspicious nodes. Tortuous portosystemic collaterals in the left upper quadrant. Reproductive: Uterus and bilateral adnexa are unremarkable. Other: Large volume of abdominopelvic ascites.  No free air. Musculoskeletal: No acute or suspicious osseous abnormality IMPRESSION: 1. Liver cirrhosis with portal hypertension. Large quantity of abdominopelvic ascites. 2. No evidence for bowel obstruction 3. Trace right-sided pleural effusion 4. Gallstones 5. Nonobstructing kidney stones Electronically Signed   By: Jasmine Pang M.D.   On: 01/12/2021 17:31   US Paracentesis  Result Date: 01/13/2021 INDICATION: Recurrent ascites request received for paracentesis EXAM: ULTRASOUND GUIDED THERAPEUTIC PARACENTESIS MEDICATIONS: Local 1% lidocaine only COMPLICATIONS: None immediate. PROCEDURE: Informed written consent was obtained from the patient after a discussion of the risks, benefits and alternatives to treatment. A timeout was performed prior to the initiation of the procedure. Initial ultrasound scanning demonstrates a large amount of ascites within the right upper abdominal quadrant. The right upper abdomen was prepped and draped in the usual sterile fashion. 1% lidocaine was used for local  anesthesia. Following this, a 19 gauge, 7-cm, Yueh catheter was introduced. An ultrasound image was saved for documentation purposes. The paracentesis was performed. The catheter was removed and a dressing was applied. The patient tolerated the procedure well without immediate post procedural complication. FINDINGS: A total of approximately 5 L of clear yellow fluid was removed. IMPRESSION: Successful ultrasound-guided paracentesis yielding 5 liters of peritoneal fluid. Read By: Pattricia Boss PA-C Electronically Signed   By: Judie Petit.  Shick M.D.   On: 01/13/2021 15:30    ____________________________________________   PROCEDURES  Procedure(s) performed (including Critical Care):  .1-3 Lead EKG Interpretation  Date/Time: 01/13/2021 4:07 PM Performed by: Merwyn Katos, MD Authorized by: Merwyn Katos, MD     Interpretation: normal     ECG rate:  85   ECG rate assessment: normal     Rhythm: sinus rhythm     Ectopy: none     Conduction: normal   .Paracentesis  Date/Time: 01/13/2021 4:07 PM  Performed by: Merwyn KatosBradler, Raynell Scott K, MD Authorized by: Merwyn KatosBradler, Prima Rayner K, MD   Consent:    Consent obtained:  Verbal   Consent given by:  Patient   Risks, benefits, and alternatives were discussed: yes     Risks discussed:  Bleeding, bowel perforation, infection and pain   Alternatives discussed:  No treatment, delayed treatment and alternative treatment Universal protocol:    Immediately prior to procedure, a time out was called: yes     Patient identity confirmed:  Verbally with patient Pre-procedure details:    Procedure purpose:  Diagnostic Anesthesia:    Anesthesia method:  Local infiltration   Local anesthetic:  Lidocaine 1% w/o epi Procedure details:    Needle gauge:  18   Ultrasound guidance: yes     Puncture site:  R lower quadrant   Fluid removed amount:  3.5L   Fluid appearance:  Amber   Dressing:  4x4 sterile gauze and adhesive bandage Post-procedure details:    Procedure completion:   Tolerated well, no immediate complications   ____________________________________________   INITIAL IMPRESSION / ASSESSMENT AND PLAN / ED COURSE  As part of my medical decision making, I reviewed the following data within the electronic medical record, if available:  Nursing notes reviewed and incorporated, Labs reviewed, EKG interpreted, Old chart reviewed, Radiograph reviewed and Notes from prior ED visits reviewed and incorporated     ED Workup: CBC, BMP, LFTs, PT/INR, Ascitic Fluid studies (Cell count with differential, Gram stain, Glucose, Protein)  Given patients history, exam there presentation is most consistent with abdominal pain from ascites volume burden. I have a low suspicion for Spontaneous Bacterial Peritonitis or other abdominal emergency at this time. The patient had a significant amount of ascites in the abdominal cavity necessitating paracentesis.  ED Interventions: Paracentesis  Disposition: As this is a new diagnosis of cirrhosis and may need intermittent paracentesis to drain all this fluid from her abdomen, will admit patient to the internal medicine service for further evaluation and management      ____________________________________________   FINAL CLINICAL IMPRESSION(S) / ED DIAGNOSES  Final diagnoses:  Ascites of liver     ED Discharge Orders     None        Note:  This document was prepared using Dragon voice recognition software and may include unintentional dictation errors.    Merwyn KatosBradler, Quamesha Mullet K, MD 01/13/21 [phone]

## 2021-01-13 NOTE — Progress Notes (Signed)
[facility] Progress Note  Patient: Joan Roman    CHE:[id number]  DOA: 01/12/2021     Date of Service: the patient was seen and examined on 01/13/2021  Chief Complaint  Patient presents with   Weakness   Brief hospital course: Joan Roman is a 70 y.o. female with no prior medical diagnosis presents to the ED  bilateral lower extremity swelling that started 1.5 months ago, and then gradually her abdominal swelling was getting worse to the point she was unable to ambulate.  She had bloody so she did not realize until it was very tight and also noticed tightening of her bra about 2 to 3 weeks ago.  ED work-up: Labs in the emergency department was remarkable for sodium 134, potassium 4.7, chloride 96, bicarb 23, BUN 18, serum creatinine of 1.10, nonfasting blood glucose 67 Emergency medicine provider ordered Lasix 20 mg IV once     Assessment and Plan: Principal Problem:   Abdominal ascites Active Problems:   Obesity, Class III, BMI 40-49.9 (morbid obesity) (HCC)   Pleural effusion   Thrombocytopenia (HCC)   Liver cirrhosis (HCC)   # Abdominal ascites secondary to liver cirrhosis with portal hypertension - No prior diagnosis of liver cirrhosis, or portal hypertension - Acute hepatitis panel negative,  hepatitis B core antibody negative, hepatitis C antibody negative, TSH , HIV antibody nonreactive hepatitis C RNA quant pending, - Peritoneal fluid culture NGTD and body fluid cell count low, did not show SBP - Complete echo ordered - Strict I's and O's - Emergency medicine provider did bedside paracentesis drain to gravity.  Approximately 2.5 L were removed at bedside, straw-colored, yellow, clear and negative for purulence S/p Albumin 25%, 12.5 g IV as needed for MAP less than 65, 2 doses  Follow IR for therapeutic paracentesis 8/17 started Lasix IV infusion to diurese BP soft, started midodrine 5 mg p.o. 3 times daily with holding parameters to support blood pressure  so that we can diurese Check BMP daily    # Pleural effusion-etiology work-up in progress, differentials include chronic liver cirrhosis versus heart failure exacerbation - Please see above for work-up   # Thrombocytopenia-I suspect this is secondary to liver cirrhosis # Elevated T bili - LFTs in the a.m. DVT prophylaxis-I scheduled Lovenox subcutaneous DVT prophylaxis for 01/13/2021 at 10 PM - However pharmacy wanted this to be discontinued - A.m. team to evaluate daily a.m. platelets and if improved, would recommend initiating pharmacologic DVT when it is appropriate   # Low albumin-I suspect this is secondary to chronic liver failure - LFTs in the a.m.   # Regular health maintenance-patient has never had a colonoscopy, mammogram - TOC consulted for PCP need   #Folate deficiency, folic acid 4.2, started oral supplement.  Follow with PCP to repeat folic acid level after 3 months  # UA shows large LE, many bacteria, WBC > 50 Patient denies any urinary symptoms Follow urine culture  Body mass index is 42.23 kg/m.  Interventions:       Diet: Heart healthy fluid striction 1.5 L/day DVT Prophylaxis: SCD, pharmacological prophylaxis contraindicated due to thrombocytopenia    Advance goals of care discussion: Full code  Family Communication: family was not present at bedside, at the time of interview.  The pt provided permission to discuss medical plan with the family. Opportunity was given to ask question and all questions were answered satisfactorily.   Disposition:  Pt is from Home, admitted with Ascites, still has significant abdominal distention due  to ascites, which precludes a safe discharge. Discharge to TBD, needs PT eval, possible SNF, when clinically improves, may take few days more.  Subjective: No overnight issues, patient was complaining of severe abdominal distention and still feels abdomen is tight, lower extremity edema.  Patient denied any chest pain or  palpitations, patient has shortness of breath secondary to ascites.  Physical Exam: General:  alert oriented to time, place, and person.  Appear in moderate distress, affect appropriate Eyes: PERRLA ENT: Oral Mucosa Clear, moist  Neck: no JVD,  Cardiovascular: S1 and S2 Present, no Murmur,  Respiratory: increased respiratory effort, Bilateral Air entry equal and Decreased, b/l Crackles, no wheezes Abdomen: Extremely distended due to ascites, mild generalized tenderness, difficult to listen BS Skin: No rashes Extremities: 3+ Pedal edema, no calf tenderness Neurologic: without any new focal findings Gait not checked due to patient safety concerns  Vitals:   01/12/21 2111 01/12/21 2349 01/13/21 0406 01/13/21 1210  BP: 114/80 117/70 109/69 110/74  Pulse: 79 84 80 86  Resp: 20 16 16 17   Temp: 98.4 F (36.9 C) 98.9 F (37.2 C) (!) 97.5 F (36.4 C) 98.4 F (36.9 C)  TempSrc: Oral     SpO2: 97% 100% 99% 92%  Weight: 111.6 kg     Height: 5\' 4"  (1.626 m)       Intake/Output Summary (Last 24 hours) at 01/13/2021 1417 Last data filed at 01/13/2021 1015 Gross per 24 hour  Intake 240 ml  Output 200 ml  Net 40 ml   Filed Weights   01/12/21 1353 01/12/21 2111  Weight: 111.1 kg 111.6 kg    Data Reviewed: I have personally reviewed and interpreted daily labs, tele strips, imagings as discussed above. I reviewed all nursing notes, pharmacy notes, vitals, pertinent old records I have discussed plan of care as described above with RN and patient/family.  CBC: Recent Labs  Lab 01/12/21 1357 01/13/21 0621  WBC 6.0 4.2  HGB 14.2 12.8  HCT 44.4 38.8  MCV 81.3 80.0  PLT 89* 80*   Basic Metabolic Panel: Recent Labs  Lab 01/12/21 1357 01/13/21 0621 01/13/21 1031  NA 135  134* 135  --   K 4.6  4.7 4.2  --   CL 97*  96* 97*  --   CO2 22  23 27   --   GLUCOSE 63*  67* 84  --   BUN 18  18 19   --   CREATININE 0.96  1.10* 0.93  --   CALCIUM 8.8*  8.9 8.4*  --   MG  --    --  1.9  PHOS  --   --  2.7    Studies: CT Abdomen Pelvis Wo Contrast  Result Date: 01/12/2021 CLINICAL DATA:  Abdominal distension EXAM: CT ABDOMEN AND PELVIS WITHOUT CONTRAST TECHNIQUE: Multidetector CT imaging of the abdomen and pelvis was performed following the standard protocol without IV contrast. COMPARISON:  None. FINDINGS: Lower chest: Lung bases demonstrate small right-sided pleural effusion. Atelectasis at the right base. Normal cardiac size. Small cardio phrenic lymph nodes. Small distal esophageal hiatal hernia. Hepatobiliary: Cirrhotic morphology of the liver. Multiple calcified gallstones. Subcentimeter hypodensity within the liver too small to further characterize. No biliary dilatation. Recanalized paraumbilical vein. Pancreas: Unremarkable. No pancreatic ductal dilatation or surrounding inflammatory changes. Spleen: Enlarged measuring 15.5 cm craniocaudad. Adrenals/Urinary Tract: Adrenal glands are normal. Multiple bilateral kidney stones without hydronephrosis. The bladder is difficult to see due to ascites. Stomach/Bowel: Centralization of the bowel. No bowel distension.  No acute bowel wall thickening. Negative appendix. Vascular/Lymphatic: Mild aortic atherosclerosis. No aneurysm. No suspicious nodes. Tortuous portosystemic collaterals in the left upper quadrant. Reproductive: Uterus and bilateral adnexa are unremarkable. Other: Large volume of abdominopelvic ascites.  No free air. Musculoskeletal: No acute or suspicious osseous abnormality IMPRESSION: 1. Liver cirrhosis with portal hypertension. Large quantity of abdominopelvic ascites. 2. No evidence for bowel obstruction 3. Trace right-sided pleural effusion 4. Gallstones 5. Nonobstructing kidney stones Electronically Signed   By: Jasmine Pang M.D.   On: 01/12/2021 17:31    Scheduled Meds:  insulin aspart  0-5 Units Subcutaneous QHS   insulin aspart  0-9 Units Subcutaneous TID WC   midodrine  5 mg Oral TID WC   Continuous  Infusions:  albumin human     furosemide (LASIX) 200 mg in dextrose 5% 100 mL (2mg /mL) infusion     PRN Meds: albumin human, dextrose, hydrALAZINE, ibuprofen, ondansetron **OR** ondansetron (ZOFRAN) IV  Time spent: 35 minutes  Author: . MD [facility] 01/13/2021 2:17 PM  To reach On-call, see care teams to locate the attending and reach out to them via [email or web address]. If 7PM-7AM, please contact night-coverage If you still have difficulty reaching the attending provider, please page the [facility] (Director on Call) for [facility] on amion for assistance.

## 2021-01-13 NOTE — Progress Notes (Signed)
Parencentisis

## 2021-01-13 NOTE — Procedures (Signed)
PROCEDURE SUMMARY:  Successful US guided paracentesis from RUQ.  Yielded 5 Liters of clear yellow fluid.  No immediate complications.  Pt tolerated well.   Specimen was not sent for labs.  EBL < 56mL  Cloretta Ned 01/13/2021 3:31 PM

## 2021-01-13 NOTE — TOC Initial Note (Signed)
Transition of Care [facility]) - Initial/Assessment Note    Patient Details  Name: Joan Roman MRN: [id number] Date of Birth: November 25, 1950  Transition of Care [facility]) CM/SW Contact:    Shelbie Hutching, RN Phone Number: 01/13/2021, 3:58 PM  Clinical Narrative:                 Patient admitted to the hospital with liver cirrhosis and portal hypertension.  Patient has no past medical history and has not been to a doctor in 10 years.  RNCM met with patient at the bedside.  Patient lives alone and is normally independent but has recently started having swelling of her abd and weakness, she reports already having swelling in her lower extremities.  Patient admits to going to Urgent care for abd pain and distention in February of this year and they recommended that patient go to the emergency room for further workup.  Patient never went to the ER.  Patient agrees to being set up with new PCP and is okay with [employer]. Appt:  Thursday 8/25 at 1pm scheduled.  Patient drives and can get to her appointments.  Expected Discharge Plan: Home/Self Care Barriers to Discharge: Continued Medical Work up   Patient Goals and CMS Choice Patient states their goals for this hospitalization and ongoing recovery are:: Patient wants to get better and be able to do the things she was doing      Expected Discharge Plan and Services Expected Discharge Plan: Home/Self Care   Discharge Planning Services: CM Consult   Living arrangements for the past 2 months: Single Family Home                 DME Arranged: N/A DME Agency: NA                  Prior Living Arrangements/Services Living arrangements for the past 2 months: Single Family Home Lives with:: Self Patient language and need for interpreter reviewed:: Yes Do you feel safe going back to the place where you live?: Yes      Need for Family Participation in Patient Care: Yes (Comment) (liver cirrhosis) Care giver support system  in place?: Yes (comment) (brothers)   Criminal Activity/Legal Involvement Pertinent to Current Situation/Hospitalization: No - Comment as needed  Activities of Daily Living Home Assistive Devices/Equipment: None ADL Screening (condition at time of admission) Patient's cognitive ability adequate to safely complete daily activities?: Yes Is the patient deaf or have difficulty hearing?: No Does the patient have difficulty seeing, even when wearing glasses/contacts?: No Does the patient have difficulty concentrating, remembering, or making decisions?: No Patient able to express need for assistance with ADLs?: Yes Does the patient have difficulty dressing or bathing?: Yes Independently performs ADLs?: No Communication: Independent Dressing (OT): Needs assistance Is this a change from baseline?: Change from baseline, expected to last <3days Grooming: Needs assistance Is this a change from baseline?: Change from baseline, expected to last <3 days Feeding: Independent Bathing: Needs assistance Is this a change from baseline?: Change from baseline, expected to last <3 days Toileting: Needs assistance Is this a change from baseline?: Change from baseline, expected to last <3 days In/Out Bed: Dependent Is this a change from baseline?: Change from baseline, expected to last <3 days Walks in Home: Dependent Is this a change from baseline?: Change from baseline, expected to last <3 days Does the patient have difficulty walking or climbing stairs?: Yes Weakness of Legs: Both Weakness of Arms/Hands: Both  Permission Sought/Granted  Permission sought to share information with : Case Manager, Family Supports Permission granted to share information with : Yes, Verbal Permission Granted  Share Information with NAME: Elta Guadel[place]     Permission granted to share info w Relationship: brother     Emotional Assessment Appearance:: Appears stated age Attitude/Demeanor/Rapport: Engaged Affect (typically  observed): Accepting Orientation: : Oriented to Self, Oriented to Place, Oriented to  Time, Oriented to Situation Alcohol / Substance Use: Not Applicable Psych Involvement: No (comment)  Admission diagnosis:  Abdominal ascites [R18.8] Ascites of liver [R18.8] Patient Active Problem List   Diagnosis Date Noted   Ascites of liver 01/13/2021   Abdominal ascites 01/12/2021   Obesity, Class III, BMI 40-49.9 (morbid obesity) ([place]) 01/12/2021   Pleural effusion 01/12/2021   Thrombocytopenia ([place]) 01/12/2021   Liver cirrhosis ([place]) 01/12/2021   PCP:  Pcp, No Pharmacy:  No Pharmacies Listed    Social Determinants of Health (SDOH) Interventions    Readmission Risk Interventions No flowsheet data found.

## 2021-01-13 NOTE — Plan of Care (Signed)
Goals of improvement to skin integrity and medical problems will improve without signs or symptoms of infection.

## 2021-01-14 ENCOUNTER — Inpatient Hospital Stay
Admit: 2021-01-14 | Discharge: 2021-01-14 | Disposition: A | Discharge status: Home / Self Care | Payer: Medicare Other | Attending: Internal Medicine | Admitting: Internal Medicine

## 2021-01-14 DIAGNOSIS — R188 Other ascites: Secondary | ICD-10-CM | POA: Diagnosis not present

## 2021-01-14 LAB — BASIC METABOLIC PANEL
Anion gap: 10 (ref 5–15)
BUN: 19 mg/dL (ref 8–23)
CO2: 29 mmol/L (ref 22–32)
Calcium: 8.2 mg/dL — ABNORMAL LOW (ref 8.9–10.3)
Chloride: 97 mmol/L — ABNORMAL LOW (ref 98–111)
Creatinine, Ser: 1.15 mg/dL — ABNORMAL HIGH (ref 0.44–1.00)
GFR, Estimated: 52 mL/min — ABNORMAL LOW (ref >60)
Glucose, Bld: 120 mg/dL — ABNORMAL HIGH (ref 70–99)
Potassium: 3.3 mmol/L — ABNORMAL LOW (ref 3.5–5.1)
Sodium: 136 mmol/L (ref 135–145)

## 2021-01-14 LAB — CBC
HCT: 41.4 % (ref 36.0–46.0)
Hemoglobin: 13.5 g/dL (ref 12.0–15.0)
MCH: 25.6 pg — ABNORMAL LOW (ref 26.0–34.0)
MCHC: 32.6 g/dL (ref 30.0–36.0)
MCV: 78.4 fL — ABNORMAL LOW (ref 80.0–100.0)
Platelets: 76 10*3/uL — ABNORMAL LOW (ref 150–400)
RBC: 5.28 MIL/uL — ABNORMAL HIGH (ref 3.87–5.11)
RDW: 17.6 % — ABNORMAL HIGH (ref 11.5–15.5)
WBC: 5.4 10*3/uL (ref 4.0–10.5)
nRBC: 0 % (ref 0.0–0.2)

## 2021-01-14 LAB — ECHOCARDIOGRAM COMPLETE
AR max vel: 2.9 cm2
AV Area VTI: 2.91 cm2
AV Area mean vel: 2.75 cm2
AV Mean grad: 4 mmHg
AV Peak grad: 6.7 mmHg
Ao pk vel: 1.29 m/s
Area-P 1/2: 3.01 cm2
Height: 64 in
S' Lateral: 2.68 cm
Weight: 3936.53 oz

## 2021-01-14 LAB — HEPATIC FUNCTION PANEL
ALT: 9 U/L (ref 0–44)
AST: 24 U/L (ref 15–41)
Albumin: 2.6 g/dL — ABNORMAL LOW (ref 3.5–5.0)
Alkaline Phosphatase: 39 U/L (ref 38–126)
Bilirubin, Direct: 0.4 mg/dL — ABNORMAL HIGH (ref 0.0–0.2)
Indirect Bilirubin: 1.2 mg/dL — ABNORMAL HIGH (ref 0.3–0.9)
Total Bilirubin: 1.6 mg/dL — ABNORMAL HIGH (ref 0.3–1.2)
Total Protein: 5.7 g/dL — ABNORMAL LOW (ref 6.5–8.1)

## 2021-01-14 LAB — MAGNESIUM: Magnesium: 1.7 mg/dL (ref 1.7–2.4)

## 2021-01-14 LAB — GLUCOSE, CAPILLARY
Glucose-Capillary: 112 mg/dL — ABNORMAL HIGH (ref 70–99)
Glucose-Capillary: 93 mg/dL (ref 70–99)
Glucose-Capillary: 94 mg/dL (ref 70–99)

## 2021-01-14 LAB — HCV RNA QUANT: HCV Quantitative: NOT DETECTED IU/mL (ref >50)

## 2021-01-14 LAB — PHOSPHORUS: Phosphorus: 3.4 mg/dL (ref 2.5–4.6)

## 2021-01-14 MED ORDER — POTASSIUM CHLORIDE CRYS ER 20 MEQ PO TBCR
40.0000 meq | EXTENDED_RELEASE_TABLET | Freq: Once | ORAL | Status: AC
  Administered 2021-01-14: 09:00:00 40 meq via ORAL
  Filled 2021-01-14: qty 2

## 2021-01-14 MED ORDER — VITAMIN D (ERGOCALCIFEROL) 1.25 MG (50000 UNIT) PO CAPS
50000.0000 [IU] | ORAL_CAPSULE | ORAL | Status: DC
  Administered 2021-01-14 – 2021-01-21 (×2): 50000 [IU] via ORAL
  Filled 2021-01-14 (×2): qty 1

## 2021-01-14 MED ORDER — SODIUM CHLORIDE 0.9 % IV SOLN
1.0000 g | INTRAVENOUS | Status: DC
  Administered 2021-01-14: 18:00:00 1 g via INTRAVENOUS
  Filled 2021-01-14: qty 1
  Filled 2021-01-14: qty 10

## 2021-01-14 MED ORDER — ALBUMIN HUMAN 25 % IV SOLN
12.5000 g | Freq: Four times a day (QID) | INTRAVENOUS | Status: AC
  Administered 2021-01-14 – 2021-01-15 (×4): 12.5 g via INTRAVENOUS
  Filled 2021-01-14 (×5): qty 50

## 2021-01-14 MED ORDER — CYANOCOBALAMIN 500 MCG PO TABS
500.0000 ug | ORAL_TABLET | Freq: Every day | ORAL | Status: DC
  Administered 2021-01-17 – 2021-01-26 (×10): 500 ug via ORAL
  Filled 2021-01-14 (×11): qty 1

## 2021-01-14 MED ORDER — CYANOCOBALAMIN 1000 MCG/ML IJ SOLN
1000.0000 ug | Freq: Every day | INTRAMUSCULAR | Status: AC
  Administered 2021-01-14 – 2021-01-16 (×3): 1000 ug via INTRAMUSCULAR
  Filled 2021-01-14 (×3): qty 1

## 2021-01-14 MED ORDER — LACTULOSE 10 GM/15ML PO SOLN
10.0000 g | Freq: Every day | ORAL | Status: DC
  Administered 2021-01-14: 10 g via ORAL
  Filled 2021-01-14: qty 30

## 2021-01-14 NOTE — Progress Notes (Signed)
*  PRELIMINARY RESULTS* Echocardiogram 2D Echocardiogram has been performed.  Joan Roman 01/14/2021, 8:54 AM 

## 2021-01-14 NOTE — Plan of Care (Signed)
  Problem: Health Behavior/Discharge Planning: Goal: Ability to manage health-related needs will improve Outcome: Progressing   Problem: Clinical Measurements: Goal: Ability to maintain clinical measurements within normal limits will improve Outcome: Progressing Goal: Will remain free from infection Outcome: Progressing   Problem: Activity: Goal: Risk for activity intolerance will decrease Outcome: Progressing   Problem: Pain Managment: Goal: General experience of comfort will improve Outcome: Progressing   

## 2021-01-14 NOTE — Progress Notes (Signed)
[facility] Progress Note  Patient: Joan Roman    ZOX:[id number]  DOA: 01/12/2021     Date of Service: the patient was seen and examined on 01/14/2021  Chief Complaint  Patient presents with   Weakness   Brief hospital course: Joan Roman is a 70 y.o. female with no prior medical diagnosis presents to the ED  bilateral lower extremity swelling that started 1.5 months ago, and then gradually her abdominal swelling was getting worse to the point she was unable to ambulate.  She had bloody so she did not realize until it was very tight and also noticed tightening of her bra about 2 to 3 weeks ago.  ED work-up: Labs in the emergency department was remarkable for sodium 134, potassium 4.7, chloride 96, bicarb 23, BUN 18, serum creatinine of 1.10, nonfasting blood glucose 67 Emergency medicine provider ordered Lasix 20 mg IV once     Assessment and Plan: Principal Problem:   Abdominal ascites Active Problems:   Obesity, Class III, BMI 40-49.9 (morbid obesity) (HCC)   Pleural effusion   Thrombocytopenia (HCC)   Liver cirrhosis (H[facility])   # Abdominal ascites secondary to liver cirrhosis with portal hypertension - No prior diagnosis of liver cirrhosis, or portal hypertension - Acute hepatitis panel negative,  hepatitis B core antibody negative, hepatitis C antibody negative, TSH , HIV antibody nonreactive hepatitis C RNA quant pending, - Peritoneal fluid culture NGTD and body fluid cell count low, did not show SBP - TTE shows LVEF 65 to 70%, grade 1 diastolic dysfunction, no wall motion abnormality. - Strict I's and O's - Emergency medicine provider did bedside paracentesis drain to gravity.  Approximately 2.5 L were removed at bedside, straw-colored, yellow, clear and negative for purulence S/p Albumin 25%, 12.5 g IV as needed for MAP less than 65, 2 doses  8/17 s/p paracentesis 5 Dr. Quintin AltoFluid was tapped by IR  8/17 started Lasix IV infusion to diurese BP soft, started  midodrine 5 mg p.o. 3 times daily with holding parameters to support blood pressure so that we can diurese 8/18 abdomen 12.5 g every 6 hours the x4 doses ordered Check BMP daily  # Hypokalemia, due to diuresis Potassium repleted, continue to monitor.   # Pleural effusion-etiology work-up in progress, differentials include chronic liver cirrhosis versus heart failure exacerbation - Please see above for work-up   # Thrombocytopenia-I suspect this is secondary to liver cirrhosis # Elevated T bili - LFTs in the a.m. DVT prophylaxis-I scheduled Lovenox subcutaneous DVT prophylaxis for 01/13/2021 at 10 PM - However pharmacy wanted this to be discontinued - A.m. team to evaluate daily a.m. platelets and if improved, would recommend initiating pharmacologic DVT when it is appropriate   # Low albumin-I suspect this is secondary to chronic liver failure - LFTs in the a.m.   # Regular health maintenance-patient has never had a colonoscopy, mammogram - TOC consulted for PCP need   # Folate deficiency, folic acid 4.2, started oral supplement.  Follow with PCP to repeat folic acid level after 3 months  # Vitamin D deficiency, started vitamin D 50,000 units p.o. weekly, follow with PCP to repeat vitamin D level after 3 months  # Vitamin B12 level 337, target >400, started B12 supplement  # UA shows large LE, many bacteria, WBC > 50 Patient denies any urinary symptoms Follow urine culture growing Klebsiella pneumonia 8/18 started ceftriaxone 1 g IV daily, follow sensitivity report   Body mass index is 42.23 kg/m.  Interventions:  Diet: Heart healthy fluid striction 1.5 L/day DVT Prophylaxis: SCD, pharmacological prophylaxis contraindicated due to thrombocytopenia    Advance goals of care discussion: Full code  Family Communication: family was not present at bedside, at the time of interview.  The pt provided permission to discuss medical plan with the family. Opportunity was given  to ask question and all questions were answered satisfactorily.   Disposition:  Pt is from Home, admitted with Ascites, still has significant abdominal distention due to ascites, which precludes a safe discharge. Discharge to TBD, needs PT eval, possible SNF, when clinically improves, may take few days more.  Subjective: No overnight issues, patient still has generalized abdominal soreness, feels little bit less discomfort after paracentesis.  Denies any shortness of breath, no chest pain or palpitations.  Patient had no bowel movement in 2 days, we will start lactulose.   Physical Exam: General:  alert oriented to time, place, and person.  Appear in moderate distress, affect appropriate Eyes: PERRLA ENT: Oral Mucosa Clear, moist  Neck: no JVD,  Cardiovascular: S1 and S2 Present, no Murmur,  Respiratory: increased respiratory effort, Bilateral Air entry equal and Decreased, b/l Crackles, no wheezes Abdomen: Extremely distended due to ascites, mild generalized tenderness, difficult to listen BS Skin: No rashes Extremities: 3+ Pedal edema, no calf tenderness Neurologic: without any new focal findings Gait not checked due to patient safety concerns  Vitals:   01/13/21 2101 01/14/21 0359 01/14/21 0846 01/14/21 1124  BP: 112/71 109/71 (!) 118/59 103/64  Pulse: 90 80 83 79  Resp: 19 20 18 16   Temp: 98 F (36.7 C) 98.1 F (36.7 C) 98 F (36.7 C) 98 F (36.7 C)  TempSrc:   Oral   SpO2: 94% 94% 95% 96%  Weight:      Height:        Intake/Output Summary (Last 24 hours) at 01/14/2021 1547 Last data filed at 01/14/2021 1521 Gross per 24 hour  Intake 247.13 ml  Output 1125 ml  Net -877.87 ml   Filed Weights   01/12/21 1353 01/12/21 2111  Weight: 111.1 kg 111.6 kg    Data Reviewed: I have personally reviewed and interpreted daily labs, tele strips, imagings as discussed above. I reviewed all nursing notes, pharmacy notes, vitals, pertinent old records I have discussed plan of  care as described above with RN and patient/family.  CBC: Recent Labs  Lab 01/12/21 1357 01/13/21 0621 01/14/21 0534  WBC 6.0 4.2 5.4  HGB 14.2 12.8 13.5  HCT 44.4 38.8 41.4  MCV 81.3 80.0 78.4*  PLT 89* 80* 76*   Basic Metabolic Panel: Recent Labs  Lab 01/12/21 1357 01/13/21 0621 01/13/21 1031 01/14/21 0534  NA 135  134* 135  --  136  K 4.6  4.7 4.2  --  3.3*  CL 97*  96* 97*  --  97*  CO2 22  23 27   --  29  GLUCOSE 63*  67* 84  --  120*  BUN 18  18 19   --  19  CREATININE 0.96  1.10* 0.93  --  1.15*  CALCIUM 8.8*  8.9 8.4*  --  8.2*  MG  --   --  1.9 1.7  PHOS  --   --  2.7 3.4    Studies: ECHOCARDIOGRAM COMPLETE  Result Date: 01/14/2021    ECHOCARDIOGRAM REPORT   Patient Name:   Date of Exam: 01/14/2021 Medical Rec #:  01/16/2021        Height:  64.0 in Accession #:    [id number]       Weight:       246.0 lb Date of Birth:  06/04/1950       BSA:          2.136 m Patient Age:    69 years         BP:           118/59 mmHg Patient Gender: F                HR:           83 bpm. Exam Location:  [facility] Procedure: 2D Echo, Cardiac Doppler and Color Doppler Indications:     Dyspnea R06.00  History:         Patient has no prior history of Echocardiogram examinations.                  Bronchitis.  Sonographer:     Cristela Blue Referring Phys:  [id number] AMY N COX Diagnosing Phys: Marcina Millard MD  Sonographer Comments: Image acquisition challenging due to patient body habitus. IMPRESSIONS  1. Left ventricular ejection fraction, by estimation, is 65 to 70%. The left ventricle has normal function. The left ventricle has no regional wall motion abnormalities. Left ventricular diastolic parameters are consistent with Grade I diastolic dysfunction (impaired relaxation).  2. Right ventricular systolic function is normal. The right ventricular size is normal.  3. The mitral valve is normal in structure. Trivial mitral valve regurgitation. No evidence of mitral  stenosis.  4. The aortic valve is normal in structure. Aortic valve regurgitation is not visualized. No aortic stenosis is present.  5. The inferior vena cava is normal in size with greater than 50% respiratory variability, suggesting right atrial pressure of 3 mmHg. FINDINGS  Left Ventricle: Left ventricular ejection fraction, by estimation, is 65 to 70%. The left ventricle has normal function. The left ventricle has no regional wall motion abnormalities. The left ventricular internal cavity size was normal in size. There is  no left ventricular hypertrophy. Left ventricular diastolic parameters are consistent with Grade I diastolic dysfunction (impaired relaxation). Right Ventricle: The right ventricular size is normal. No increase in right ventricular wall thickness. Right ventricular systolic function is normal. Left Atrium: Left atrial size was normal in size. Right Atrium: Right atrial size was normal in size. Pericardium: There is no evidence of pericardial effusion. Mitral Valve: The mitral valve is normal in structure. Trivial mitral valve regurgitation. No evidence of mitral valve stenosis. Tricuspid Valve: The tricuspid valve is normal in structure. Tricuspid valve regurgitation is trivial. No evidence of tricuspid stenosis. Aortic Valve: The aortic valve is normal in structure. Aortic valve regurgitation is not visualized. No aortic stenosis is present. Aortic valve mean gradient measures 4.0 mmHg. Aortic valve peak gradient measures 6.7 mmHg. Aortic valve area, by VTI measures 2.91 cm. Pulmonic Valve: The pulmonic valve was normal in structure. Pulmonic valve regurgitation is not visualized. No evidence of pulmonic stenosis. Aorta: The aortic root is normal in size and structure. Venous: The inferior vena cava is normal in size with greater than 50% respiratory variability, suggesting right atrial pressure of 3 mmHg. IAS/Shunts: No atrial level shunt detected by color flow Doppler.  LEFT VENTRICLE PLAX  2D LVIDd:         4.41 cm  Diastology LVIDs:         2.68 cm  LV e' medial:    4.90 cm/s LV PW:  1.17 cm  LV E/e' medial:  9.4 LV IVS:        0.79 cm  LV e' lateral:   6.20 cm/s LVOT diam:     2.00 cm  LV E/e' lateral: 7.5 LV SV:         68 LV SV Index:   32 LVOT Area:     3.14 cm  RIGHT VENTRICLE RV Basal diam:  3.13 cm RV S prime:     12.40 cm/s TAPSE (M-mode): 3.5 cm LEFT ATRIUM             Index       RIGHT ATRIUM           Index LA diam:        4.30 cm 2.01 cm/m  RA Area:     11.40 cm LA Vol (A2C):   49.9 ml 23.36 ml/m RA Volume:   22.20 ml  10.39 ml/m LA Vol (A4C):   33.4 ml 15.63 ml/m LA Biplane Vol: 43.3 ml 20.27 ml/m  AORTIC VALVE AV Area (Vmax):    2.90 cm AV Area (Vmean):   2.75 cm AV Area (VTI):     2.91 cm AV Vmax:           129.00 cm/s AV Vmean:          95.600 cm/s AV VTI:            0.234 m AV Peak Grad:      6.7 mmHg AV Mean Grad:      4.0 mmHg LVOT Vmax:         119.00 cm/s LVOT Vmean:        83.700 cm/s LVOT VTI:          0.217 m LVOT/AV VTI ratio: 0.93  AORTA Ao Root diam: 3.10 cm MITRAL VALVE               TRICUSPID VALVE MV Area (PHT): 3.01 cm    TR Peak grad:   27.5 mmHg MV Decel Time: 252 msec    TR Vmax:        262.00 cm/s MV E velocity: 46.30 cm/s MV A velocity: 83.10 cm/s  SHUNTS MV E/A ratio:  0.56        Systemic VTI:  0.22 m                            Systemic Diam: 2.00 cm Marcina Millard MD Electronically signed by Marcina Millard MD Signature Date/Time: 01/14/2021/1:20:09 PM    Final     Scheduled Meds:  cyanocobalamin  1,000 mcg Intramuscular Daily   folic acid  1 mg Oral Daily   insulin aspart  0-5 Units Subcutaneous QHS   insulin aspart  0-9 Units Subcutaneous TID WC   midodrine  5 mg Oral TID WC   [START ON 01/17/2021] vitamin B-12  500 mcg Oral Daily   Vitamin D (Ergocalciferol)  50,000 Units Oral Q7 days   Continuous Infusions:  albumin human     albumin human 12.5 g (01/14/21 1516)   furosemide (LASIX) 200 mg in dextrose 5% 100 mL (2mg /mL)  infusion 8 mg/hr (01/14/21 1207)   PRN Meds: albumin human, dextrose, hydrALAZINE, ibuprofen, ondansetron **OR** ondansetron (ZOFRAN) IV, simethicone  Time spent: 35 minutes  Author: 01/16/21. MD [facility] 01/14/2021 3:47 PM  To reach On-call, see care teams to locate the attending and reach out to them via [email or web address]. If 7PM-7AM,  please contact night-coverage If you still have difficulty reaching the attending provider, please page the [facility] (Director on Call) for [facility] on amion for assistance.

## 2021-01-15 ENCOUNTER — Inpatient Hospital Stay: Payer: Medicare Other

## 2021-01-15 DIAGNOSIS — R188 Other ascites: Secondary | ICD-10-CM | POA: Diagnosis not present

## 2021-01-15 LAB — BASIC METABOLIC PANEL
Anion gap: 10 (ref 5–15)
BUN: 18 mg/dL (ref 8–23)
CO2: 31 mmol/L (ref 22–32)
Calcium: 8.3 mg/dL — ABNORMAL LOW (ref 8.9–10.3)
Chloride: 95 mmol/L — ABNORMAL LOW (ref 98–111)
Creatinine, Ser: 1.18 mg/dL — ABNORMAL HIGH (ref 0.44–1.00)
GFR, Estimated: 50 mL/min — ABNORMAL LOW (ref >60)
Glucose, Bld: 95 mg/dL (ref 70–99)
Potassium: 3.2 mmol/L — ABNORMAL LOW (ref 3.5–5.1)
Sodium: 136 mmol/L (ref 135–145)

## 2021-01-15 LAB — CBC
HCT: 38.5 % (ref 36.0–46.0)
Hemoglobin: 12.8 g/dL (ref 12.0–15.0)
MCH: 26.6 pg (ref 26.0–34.0)
MCHC: 33.2 g/dL (ref 30.0–36.0)
MCV: 80 fL (ref 80.0–100.0)
Platelets: 62 10*3/uL — ABNORMAL LOW (ref 150–400)
RBC: 4.81 MIL/uL (ref 3.87–5.11)
RDW: 17.2 % — ABNORMAL HIGH (ref 11.5–15.5)
WBC: 3.9 10*3/uL — ABNORMAL LOW (ref 4.0–10.5)
nRBC: 0 % (ref 0.0–0.2)

## 2021-01-15 LAB — HEPATIC FUNCTION PANEL
ALT: 7 U/L (ref 0–44)
AST: 19 U/L (ref 15–41)
Albumin: 3.3 g/dL — ABNORMAL LOW (ref 3.5–5.0)
Alkaline Phosphatase: 37 U/L — ABNORMAL LOW (ref 38–126)
Bilirubin, Direct: 0.5 mg/dL — ABNORMAL HIGH (ref 0.0–0.2)
Indirect Bilirubin: 1.2 mg/dL — ABNORMAL HIGH (ref 0.3–0.9)
Total Bilirubin: 1.7 mg/dL — ABNORMAL HIGH (ref 0.3–1.2)
Total Protein: 6.1 g/dL — ABNORMAL LOW (ref 6.5–8.1)

## 2021-01-15 LAB — URINE CULTURE: Culture: >=100000 — AB

## 2021-01-15 LAB — GLUCOSE, CAPILLARY
Glucose-Capillary: 85 mg/dL (ref 70–99)
Glucose-Capillary: 120 mg/dL — ABNORMAL HIGH (ref 70–99)
Glucose-Capillary: 89 mg/dL (ref 70–99)

## 2021-01-15 LAB — MAGNESIUM: Magnesium: 1.7 mg/dL (ref 1.7–2.4)

## 2021-01-15 LAB — PHOSPHORUS: Phosphorus: 3.4 mg/dL (ref 2.5–4.6)

## 2021-01-15 MED ORDER — POTASSIUM CHLORIDE CRYS ER 20 MEQ PO TBCR
40.0000 meq | EXTENDED_RELEASE_TABLET | ORAL | Status: AC
  Administered 2021-01-15 (×2): 40 meq via ORAL
  Filled 2021-01-15 (×2): qty 2

## 2021-01-15 MED ORDER — POTASSIUM CHLORIDE 10 MEQ/100ML IV SOLN
10.0000 meq | INTRAVENOUS | Status: DC
  Administered 2021-01-15: 11:00:00 10 meq via INTRAVENOUS
  Filled 2021-01-15: qty 100

## 2021-01-15 MED ORDER — ALBUMIN HUMAN 25 % IV SOLN
12.5000 g | Freq: Four times a day (QID) | INTRAVENOUS | Status: DC
  Administered 2021-01-15 – 2021-01-17 (×7): 12.5 g via INTRAVENOUS
  Filled 2021-01-15 (×9): qty 50

## 2021-01-15 MED ORDER — CEPHALEXIN 250 MG PO CAPS
250.0000 mg | ORAL_CAPSULE | Freq: Four times a day (QID) | ORAL | Status: AC
  Administered 2021-01-15 – 2021-01-18 (×12): 250 mg via ORAL
  Filled 2021-01-15 (×13): qty 1

## 2021-01-15 MED ORDER — POTASSIUM CHLORIDE CRYS ER 20 MEQ PO TBCR
40.0000 meq | EXTENDED_RELEASE_TABLET | Freq: Two times a day (BID) | ORAL | Status: DC
  Administered 2021-01-15: 11:00:00 40 meq via ORAL
  Filled 2021-01-15: qty 2

## 2021-01-15 MED ORDER — POTASSIUM CHLORIDE CRYS ER 20 MEQ PO TBCR: 40.0000 meq | EXTENDED_RELEASE_TABLET | Freq: Once | ORAL | Status: DC

## 2021-01-15 MED ORDER — LACTULOSE 10 GM/15ML PO SOLN
10.0000 g | Freq: Two times a day (BID) | ORAL | Status: DC
  Administered 2021-01-15 – 2021-01-17 (×5): 10 g via ORAL
  Filled 2021-01-15 (×5): qty 30

## 2021-01-15 NOTE — Progress Notes (Signed)
[facility] Progress Note  Patient: Joan Roman    HFW:[id number]  DOA: 01/12/2021     Date of Service: the patient was seen and examined on 01/15/2021  Chief Complaint  Patient presents with   Weakness   Brief hospital course: Joan Roman is a 70 y.o. female with no prior medical diagnosis presents to the ED  bilateral lower extremity swelling that started 1.5 months ago, and then gradually her abdominal swelling was getting worse to the point she was unable to ambulate.  She had bloody so she did not realize until it was very tight and also noticed tightening of her bra about 2 to 3 weeks ago.  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 - Strict I's and O's - Emergency medicine provider did bedside paracentesis drain to gravity.  Approximately 2.5 L were removed at bedside, straw-colored, yellow, clear and negative for purulence S/p Albumin 25%, 12.5 g IV as needed for MAP less than 65, 2 doses  8/17 s/p paracentesis 5L Fluid was tapped by IR  8/17 started Lasix IV infusion to diurese BP soft, started midodrine 5 mg  p.o. 3 times daily with holding parameters to support blood pressure so that we can diurese 8/18 albumin 12.5 g every 6 hours the x4 doses ordered 8/19 albumin 12.5 g every 6 hourly for 2 days 8/19 repeat paracentesis Check BMP daily  # Hypokalemia, due to diuresis Potassium repleted, continue to monitor.   # Pleural effusion-etiology work-up in progress, differentials include chronic liver cirrhosis versus heart failure exacerbation - Please see above for work-up   # Thrombocytopenia-I suspect this is secondary to liver cirrhosis # Elevated T bili - LFTs in the a.m. DVT prophylaxis-I scheduled Lovenox subcutaneous DVT prophylaxis for 01/13/2021 at 10 PM - However pharmacy wanted this to be discontinued - A.m. team to evaluate daily a.m. platelets and if improved, would recommend initiating pharmacologic DVT when it is appropriate   # Low albumin-I suspect this is secondary to chronic liver failure - LFTs in the a.m.   # Regular health maintenance-patient has never had a colonoscopy, mammogram - TOC consulted for PCP need   # Folate deficiency, folic acid 4.2, started oral supplement.  Follow with PCP to repeat folic acid level after 3 months  # Vitamin D deficiency, started vitamin D 50,000 units p.o. weekly, follow with PCP to repeat vitamin D level after 3 months  # Vitamin B12 level 337, target >400, started B12 supplement  # UA shows large LE, many bacteria, WBC > 50 Patient denies any urinary symptoms Follow urine culture growing Klebsiella pneumonia 8/18 s/p Ceftriaxone 1 g IV x one  dose, switch to Keflex to 50 mg p.o. every 6 hourly for 3 days as per pharmacy recommendation   Body mass index is 42.23 kg/m.  Interventions:       Diet: Heart healthy fluid striction 1.5 L/day DVT Prophylaxis: SCD, pharmacological prophylaxis contraindicated due to thrombocytopenia    Advance goals of care discussion: Full code  Family Communication: family was not present at  bedside, at the time of interview.  The pt provided permission to discuss medical plan with the family. Opportunity was given to ask question and all questions were answered satisfactorily.   Disposition:  Pt is from Home, admitted with Ascites, still has significant abdominal distention due to ascites, which precludes a safe discharge. Discharge to TBD, needs PT eval, possible SNF, when clinically improves, may take few days more.  Subjective: No overnight issues, patient still has generalized abdominal soreness, due to abd distention 2/2 ascites,   Denies any shortness of breath, no chest pain or palpitations.  Patient had small BM, increased lactulose BID.   Physical Exam: General:  alert oriented to time, place, and person.  Appear in moderate distress, affect appropriate Eyes: PERRLA ENT: Oral Mucosa Clear, moist  Neck: no JVD,  Cardiovascular: S1 and S2 Present, no Murmur,  Respiratory: increased respiratory effort, Bilateral Air entry equal and Decreased, b/l Crackles, no wheezes Abdomen: Extremely distended due to ascites, mild generalized tenderness, difficult to listen BS Skin: No rashes Extremities: 3+ Pedal edema, no calf tenderness Neurologic: without any new focal findings Gait not checked due to patient safety concerns  Vitals:   01/14/21 2145 01/15/21 0525 01/15/21 0728 01/15/21 1134  BP: 105/69 98/60 94/60  90/64  Pulse: 74 68 68 70  Resp: 16 18 16 18   Temp: 97.9 F (36.6 C) 97.7 F (36.5 C) (!) 97.5 F (36.4 C) 97.8 F (36.6 C)  TempSrc: Oral  Oral Oral  SpO2: 95% 95% 95% 99%  Weight:      Height:        Intake/Output Summary (Last 24 hours) at 01/15/2021 1235 Last data filed at 01/15/2021 1105 Gross per 24 hour  Intake 331.28 ml  Output 2275 ml  Net -1943.72 ml   Filed Weights   01/12/21 1353 01/12/21 2111  Weight: 111.1 kg 111.6 kg    Data Reviewed: I have personally reviewed and interpreted daily labs, tele strips, imagings as discussed above. I  reviewed all nursing notes, pharmacy notes, vitals, pertinent old records I have discussed plan of care as described above with RN and patient/family.  CBC: Recent Labs  Lab 01/12/21 1357 01/13/21 0621 01/14/21 0534 01/15/21 0422  WBC 6.0 4.2 5.4 3.9*  HGB 14.2 12.8 13.5 12.8  HCT 44.4 38.8 41.4 38.5  MCV 81.3 80.0 78.4* 80.0  PLT 89* 80* 76* 62*   Basic Metabolic Panel: Recent Labs  Lab 01/12/21 1357 01/13/21 0621 01/13/21 1031 01/14/21 0534 01/15/21 0422  NA 135  134* 135  --  136 136  K 4.6  4.7 4.2  --  3.3* 3.2*  CL 97*  96* 97*  --  97* 95*  CO2 22  23 27   --  29 31  GLUCOSE 63*  67* 84  --  120* 95  BUN 18  18 19   --  19 18  CREATININE 0.96  1.10* 0.93  --  1.15* 1.18*  CALCIUM 8.8*  8.9 8.4*  --  8.2* 8.3*  MG  --   --  1.9 1.7 1.7  PHOS  --   --  2.7 3.4 3.4    Studies: No results found.  Scheduled Meds:  cephALEXin  250 mg Oral Q6H   cyanocobalamin  1,000 mcg Intramuscular Daily   folic acid  1 mg Oral Daily   insulin aspart  0-5 Units Subcutaneous QHS   insulin aspart  0-9 Units Subcutaneous TID WC   lactulose  10 g Oral BID   midodrine  5 mg Oral TID WC   potassium chloride  40 mEq Oral Q4H   [START ON 01/17/2021] vitamin B-12  500 mcg Oral Daily   Vitamin D (Ergocalciferol)  50,000 Units Oral Q7 days   Continuous Infusions:  albumin human     albumin human     furosemide (LASIX) 200 mg in dextrose 5% 100 mL (2mg /mL) infusion 8 mg/hr (01/14/21 2021)   PRN Meds: albumin human, dextrose, hydrALAZINE, ibuprofen, ondansetron **OR** ondansetron (ZOFRAN) IV, simethicone  Time spent: 35 minutes  Author: 01/16/21. MD [facility] 01/15/2021 12:35 PM  To reach On-call, see care teams to locate the attending and reach out to them via [email or web address]. If 7PM-7AM, please contact night-coverage If you still have difficulty reaching the attending provider, please page the [facility] (Director on Call) for [facility] on amion for  assistance.

## 2021-01-15 NOTE — Procedures (Signed)
Ultrasound-guided therapeutic paracentesis performed yielding 6.8 liters of yellow fluid. No immediate complications. EBL < 2 cc.

## 2021-01-15 NOTE — Care Management Important Message (Signed)
Important Message  Patient Details  Name: Joan Roman MRN: [id number] Date of Birth: 1951/05/29   Medicare Important Message Given:  Yes     [name] 01/15/2021, 11:30 AM

## 2021-01-16 DIAGNOSIS — R188 Other ascites: Secondary | ICD-10-CM | POA: Diagnosis not present

## 2021-01-16 LAB — BODY FLUID CULTURE W GRAM STAIN: Culture: NO GROWTH

## 2021-01-16 LAB — CBC
HCT: 39 % (ref 36.0–46.0)
Hemoglobin: 12.8 g/dL (ref 12.0–15.0)
MCH: 25.7 pg — ABNORMAL LOW (ref 26.0–34.0)
MCHC: 32.8 g/dL (ref 30.0–36.0)
MCV: 78.2 fL — ABNORMAL LOW (ref 80.0–100.0)
Platelets: 65 10*3/uL — ABNORMAL LOW (ref 150–400)
RBC: 4.99 MIL/uL (ref 3.87–5.11)
RDW: 17 % — ABNORMAL HIGH (ref 11.5–15.5)
WBC: 3.9 10*3/uL — ABNORMAL LOW (ref 4.0–10.5)
nRBC: 0 % (ref 0.0–0.2)

## 2021-01-16 LAB — BASIC METABOLIC PANEL
Anion gap: 10 (ref 5–15)
BUN: 16 mg/dL (ref 8–23)
CO2: 32 mmol/L (ref 22–32)
Calcium: 8 mg/dL — ABNORMAL LOW (ref 8.9–10.3)
Chloride: 94 mmol/L — ABNORMAL LOW (ref 98–111)
Creatinine, Ser: 1.08 mg/dL — ABNORMAL HIGH (ref 0.44–1.00)
GFR, Estimated: 56 mL/min — ABNORMAL LOW (ref >60)
Glucose, Bld: 98 mg/dL (ref 70–99)
Potassium: 2.9 mmol/L — ABNORMAL LOW (ref 3.5–5.1)
Sodium: 136 mmol/L (ref 135–145)

## 2021-01-16 LAB — GLUCOSE, CAPILLARY
Glucose-Capillary: 88 mg/dL (ref 70–99)
Glucose-Capillary: 92 mg/dL (ref 70–99)
Glucose-Capillary: 100 mg/dL — ABNORMAL HIGH (ref 70–99)
Glucose-Capillary: 77 mg/dL (ref 70–99)

## 2021-01-16 LAB — HEPATIC FUNCTION PANEL
ALT: 6 U/L (ref 0–44)
AST: 19 U/L (ref 15–41)
Albumin: 2.8 g/dL — ABNORMAL LOW (ref 3.5–5.0)
Alkaline Phosphatase: 35 U/L — ABNORMAL LOW (ref 38–126)
Bilirubin, Direct: 0.4 mg/dL — ABNORMAL HIGH (ref 0.0–0.2)
Indirect Bilirubin: 1.4 mg/dL — ABNORMAL HIGH (ref 0.3–0.9)
Total Bilirubin: 1.8 mg/dL — ABNORMAL HIGH (ref 0.3–1.2)
Total Protein: 5.2 g/dL — ABNORMAL LOW (ref 6.5–8.1)

## 2021-01-16 LAB — MAGNESIUM: Magnesium: 1.5 mg/dL — ABNORMAL LOW (ref 1.7–2.4)

## 2021-01-16 LAB — PHOSPHORUS: Phosphorus: 2.7 mg/dL (ref 2.5–4.6)

## 2021-01-16 MED ORDER — MAGNESIUM SULFATE 2 GM/50ML IV SOLN
2.0000 g | Freq: Once | INTRAVENOUS | Status: AC
  Administered 2021-01-16: 2 g via INTRAVENOUS
  Filled 2021-01-16: qty 50

## 2021-01-16 MED ORDER — POTASSIUM CHLORIDE 10 MEQ/100ML IV SOLN
10.0000 meq | INTRAVENOUS | Status: AC
  Administered 2021-01-16 (×4): 10 meq via INTRAVENOUS
  Filled 2021-01-16 (×3): qty 100

## 2021-01-16 MED ORDER — POTASSIUM CHLORIDE CRYS ER 20 MEQ PO TBCR
40.0000 meq | EXTENDED_RELEASE_TABLET | ORAL | Status: AC
  Administered 2021-01-16 (×2): 40 meq via ORAL
  Filled 2021-01-16 (×2): qty 2

## 2021-01-16 MED ORDER — MIDODRINE HCL 5 MG PO TABS
10.0000 mg | ORAL_TABLET | Freq: Three times a day (TID) | ORAL | Status: DC
  Administered 2021-01-16 – 2021-01-26 (×32): 10 mg via ORAL
  Filled 2021-01-16 (×32): qty 2

## 2021-01-16 MED ORDER — MIDODRINE HCL 5 MG PO TABS
5.0000 mg | ORAL_TABLET | Freq: Once | ORAL | Status: AC
  Administered 2021-01-16: 09:00:00 5 mg via ORAL
  Filled 2021-01-16: qty 1

## 2021-01-16 MED ORDER — MIDODRINE HCL 5 MG PO TABS: 10.0000 mg | ORAL_TABLET | Freq: Three times a day (TID) | ORAL | Status: DC

## 2021-01-16 NOTE — Progress Notes (Signed)
[facility] Progress Note  Patient: Joan Roman    UYQ:[id number]  DOA: 01/12/2021     Date of Service: the patient was seen and examined on 01/16/2021  Chief Complaint  Patient presents with   Weakness   Brief hospital course: Joan Roman is a 70 y.o. female with no prior medical diagnosis presents to the ED  bilateral lower extremity swelling that started 1.5 months ago, and then gradually her abdominal swelling was getting worse to the point she was unable to ambulate.  She had bloody so she did not realize until it was very tight and also noticed tightening of her bra about 2 to 3 weeks ago.  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 - Strict I's and O's - Emergency medicine provider did bedside paracentesis drain to gravity.  Approximately 2.5 L were removed at bedside, straw-colored, yellow, clear and negative for purulence S/p Albumin 25%, 12.5 g IV as needed for MAP less than 65, 2 doses  8/17 s/p paracentesis 5L Fluid was tapped by IR  8/17 started Lasix IV infusion to diurese BP soft, started midodrine 5 mg  p.o. 3 times daily with holding parameters to support blood pressure so that we can diurese 8/18 albumin 12.5 g every 6 hours the x4 doses ordered 8/19 albumin 12.5 g every 6 hourly for 2 days 8/19 repeat paracentesis 6.8 L fluid was tapped 8/20 increased midodrine 10 mg p.o. 3 times daily due to persistent low blood pressure Check BMP daily  # Hypokalemia, due to diuresis Potassium repleted, continue to monitor.   # Pleural effusion-most likely due to chronic liver cirrhosis Patient has grade 1 diastolic HF, LVEF 65 to 70%    # Thrombocytopenia-I suspect this is secondary to liver cirrhosis # Elevated T bili - monitor LFTs and plts count     # Low albumin-I suspect this is secondary to chronic liver failure - LFTs daily    # Regular health maintenance-patient has never had a colonoscopy, mammogram - TOC consulted for PCP need   # Folate deficiency, folic acid 4.2, started oral supplement.  Follow with PCP to repeat folic acid level after 3 months  # Vitamin D deficiency, started vitamin D 50,000 units p.o. weekly, follow with PCP to repeat vitamin D level after 3 months  # Vitamin B12 level 337, target >400, started B12 supplement  # UA shows large LE, many bacteria, WBC > 50 Patient denies any urinary symptoms Follow urine culture growing Klebsiella pneumonia 8/18 s/p Ceftriaxone 1 g IV x one dose, switch to Keflex to 50 mg p.o. every 6 hourly for 3 days as per pharmacy recommendation  Body mass index is 42.23 kg/m.  Interventions:       Diet: Heart healthy fluid striction 1.5 L/day DVT Prophylaxis: SCD, pharmacological prophylaxis contraindicated due to thrombocytopenia    Advance goals of care discussion: Full code  Family Communication: family was not present at bedside, at the time of interview.  The pt provided permission to discuss medical plan with the family. Opportunity was given to ask question and all questions were answered satisfactorily.    Disposition:  Pt is from Home, admitted with Ascites, still has significant abdominal distention due to ascites, which precludes a safe discharge. Discharge to TBD, needs PT eval, possible SNF, when clinically improves, may take few days more.  Subjective: No overnight issues, patient was having discomfort secondary to IV potassium replacement.  Abdomen feels soft, patient tolerated paracentesis yesterday.  Denied any other active issues.   Physical Exam: General:  alert oriented to time, place, and person.  Appear in moderate distress, affect appropriate Eyes: PERRLA ENT: Oral Mucosa Clear, moist  Neck: no JVD,  Cardiovascular: S1 and S2 Present, no Murmur,  Respiratory: increased respiratory effort, Bilateral Air entry equal and Decreased, mild bibasilar crackles, no wheezes Abdomen: Extremely distended due to ascites, mild generalized tenderness, difficult to listen BS Skin: No rashes Extremities: 3+ Pedal edema, no calf tenderness Neurologic: without any new focal findings Gait not checked due to patient safety concerns  Vitals:   01/15/21 2313 01/16/21 0518 01/16/21 0735 01/16/21 1121  BP: (!) 94/53 (!) 96/59 (!) 90/56 100/64  Pulse: 73 74 73 71  Resp: 16 16 15 20   Temp: 97.8 F (36.6 C) (!) 97.5 F (36.4 C) 98.1 F (36.7 C) 98 F (36.7 C)  TempSrc:  Oral    SpO2: 95% 94% 93% 93%  Weight:      Height:        Intake/Output Summary (Last 24 hours) at 01/16/2021 1236 Last data filed at 01/16/2021 1141 Gross per 24 hour  Intake 805.15 ml  Output 1850 ml  Net -1044.85 ml   Filed Weights   01/12/21 1353 01/12/21 2111  Weight: 111.1 kg 111.6 kg    Data Reviewed: I have personally reviewed and interpreted daily labs, tele strips, imagings as discussed above. I reviewed all nursing notes, pharmacy notes, vitals, pertinent old records I have discussed plan of care as described above with RN and patient/family.  CBC: Recent Labs  Lab 01/12/21 1357 01/13/21 0621  01/14/21 0534 01/15/21 0422 01/16/21 0418  WBC 6.0 4.2 5.4 3.9* 3.9*  HGB 14.2 12.8 13.5 12.8 12.8  HCT 44.4 38.8 41.4 38.5 39.0  MCV 81.3 80.0 78.4* 80.0 78.2*  PLT 89* 80* 76* 62* 65*   Basic Metabolic Panel: Recent Labs  Lab 01/12/21 1357 01/13/21 0621 01/13/21 1031 01/14/21 0534 01/15/21 0422 01/16/21 0418  NA 135  134* 135  --  136 136 136  K 4.6  4.7 4.2  --  3.3* 3.2* 2.9*  CL 97*  96* 97*  --  97* 95* 94*  CO2 22  23 27   --  29 31 32  GLUCOSE 63*  67* 84  --  120* 95 98  BUN 18  18 19   --  19 18 16   CREATININE 0.96  1.10* 0.93  --  1.15* 1.18* 1.08*  CALCIUM 8.8*  8.9 8.4*  --  8.2* 8.3* 8.0*  MG  --   --  1.9 1.7 1.7 1.5*  PHOS  --   --  2.7 3.4 3.4 2.7  Studies: US Paracentesis  Result Date: 01/15/2021 INDICATION: Patient with history of cirrhosis, portal hypertension, recurrent ascites. Request received for therapeutic paracentesis. EXAM: ULTRASOUND GUIDED  THERAPEUTIC PARACENTESIS MEDICATIONS: None. COMPLICATIONS: None immediate. PROCEDURE: Informed written consent was obtained from the patient after a discussion of the risks, benefits and alternatives to treatment. A timeout was performed prior to the initiation of the procedure. Initial ultrasound scanning demonstrates a large amount of ascites within the left lower abdominal quadrant. The left lower abdomen was prepped and draped in the usual sterile fashion. 1% lidocaine was used for local anesthesia. Following this, a 19 gauge, 10-cm, Yueh catheter was introduced. An ultrasound image was saved for documentation purposes. The paracentesis was performed. The catheter was removed and a dressing was applied. The patient tolerated the procedure well without immediate post procedural complication. Patient received post-procedure intravenous albumin; see nursing notes for details. FINDINGS: A total of approximately 6.8 liters of yellow fluid was removed. IMPRESSION: Successful ultrasound-guided therapeutic  paracentesis yielding 6.8 liters of peritoneal fluid. Read by: Jeananne Rama, PA-C Electronically Signed   By: Olive Bass M.D.   On: 01/15/2021 16:40    Scheduled Meds:  cephALEXin  250 mg Oral Q6H   folic acid  1 mg Oral Daily   insulin aspart  0-5 Units Subcutaneous QHS   insulin aspart  0-9 Units Subcutaneous TID WC   lactulose  10 g Oral BID   midodrine  10 mg Oral TID WC   potassium chloride  40 mEq Oral Q4H   [START ON 01/17/2021] vitamin B-12  500 mcg Oral Daily   Vitamin D (Ergocalciferol)  50,000 Units Oral Q7 days   Continuous Infusions:  albumin human     albumin human 12.5 g (01/16/21 0519)   furosemide (LASIX) 200 mg in dextrose 5% 100 mL (2mg /mL) infusion 8 mg/hr (01/16/21 1034)   magnesium sulfate bolus IVPB     potassium chloride 10 mEq (01/16/21 1141)   PRN Meds: albumin human, ibuprofen, ondansetron **OR** ondansetron (ZOFRAN) IV, simethicone  Time spent: 35 minutes  Author: 01/18/21. MD [facility] 01/16/2021 12:36 PM  To reach On-call, see care teams to locate the attending and reach out to them via [email or web address]. If 7PM-7AM, please contact night-coverage If you still have difficulty reaching the attending provider, please page the [facility] (Director on Call) for [facility] on amion for assistance.

## 2021-01-17 DIAGNOSIS — J90 Pleural effusion, not elsewhere classified: Secondary | ICD-10-CM | POA: Diagnosis not present

## 2021-01-17 DIAGNOSIS — D696 Thrombocytopenia, unspecified: Secondary | ICD-10-CM | POA: Diagnosis not present

## 2021-01-17 DIAGNOSIS — K7460 Unspecified cirrhosis of liver: Secondary | ICD-10-CM | POA: Diagnosis not present

## 2021-01-17 DIAGNOSIS — R188 Other ascites: Secondary | ICD-10-CM | POA: Diagnosis not present

## 2021-01-17 LAB — CBC
HCT: 38.3 % (ref 36.0–46.0)
Hemoglobin: 12.5 g/dL (ref 12.0–15.0)
MCH: 26.2 pg (ref 26.0–34.0)
MCHC: 32.6 g/dL (ref 30.0–36.0)
MCV: 80.3 fL (ref 80.0–100.0)
Platelets: 66 10*3/uL — ABNORMAL LOW (ref 150–400)
RBC: 4.77 MIL/uL (ref 3.87–5.11)
RDW: 16.9 % — ABNORMAL HIGH (ref 11.5–15.5)
WBC: 3.4 10*3/uL — ABNORMAL LOW (ref 4.0–10.5)
nRBC: 0 % (ref 0.0–0.2)

## 2021-01-17 LAB — BASIC METABOLIC PANEL
Anion gap: 13 (ref 5–15)
BUN: 15 mg/dL (ref 8–23)
CO2: 33 mmol/L — ABNORMAL HIGH (ref 22–32)
Calcium: 8 mg/dL — ABNORMAL LOW (ref 8.9–10.3)
Chloride: 90 mmol/L — ABNORMAL LOW (ref 98–111)
Creatinine, Ser: 0.9 mg/dL (ref 0.44–1.00)
GFR, Estimated: >60 mL/min (ref >60)
Glucose, Bld: 91 mg/dL (ref 70–99)
Potassium: 3 mmol/L — ABNORMAL LOW (ref 3.5–5.1)
Sodium: 136 mmol/L (ref 135–145)

## 2021-01-17 LAB — LIPID PANEL
Cholesterol: 54 mg/dL (ref 0–200)
HDL: 13 mg/dL — ABNORMAL LOW (ref >40)
LDL Cholesterol: 28 mg/dL (ref 0–99)
Total CHOL/HDL Ratio: 4.2 RATIO
Triglycerides: 67 mg/dL (ref <150)
VLDL: 13 mg/dL (ref 0–40)

## 2021-01-17 LAB — PHOSPHORUS: Phosphorus: 2.5 mg/dL (ref 2.5–4.6)

## 2021-01-17 LAB — MAGNESIUM: Magnesium: 1.6 mg/dL — ABNORMAL LOW (ref 1.7–2.4)

## 2021-01-17 LAB — GLUCOSE, CAPILLARY
Glucose-Capillary: 89 mg/dL (ref 70–99)
Glucose-Capillary: 103 mg/dL — ABNORMAL HIGH (ref 70–99)
Glucose-Capillary: 101 mg/dL — ABNORMAL HIGH (ref 70–99)

## 2021-01-17 MED ORDER — SPIRONOLACTONE 25 MG PO TABS
100.0000 mg | ORAL_TABLET | Freq: Every day | ORAL | Status: DC
  Administered 2021-01-17 – 2021-01-26 (×10): 100 mg via ORAL
  Filled 2021-01-17 (×10): qty 4

## 2021-01-17 MED ORDER — SENNOSIDES-DOCUSATE SODIUM 8.6-50 MG PO TABS
1.0000 | ORAL_TABLET | Freq: Every evening | ORAL | Status: DC | PRN
Start: 2021-01-17 — End: 2021-01-26

## 2021-01-17 MED ORDER — POTASSIUM CHLORIDE CRYS ER 20 MEQ PO TBCR
40.0000 meq | EXTENDED_RELEASE_TABLET | ORAL | Status: AC
  Administered 2021-01-17 (×2): 40 meq via ORAL
  Filled 2021-01-17 (×2): qty 2

## 2021-01-17 MED ORDER — DM-GUAIFENESIN ER 30-600 MG PO TB12
1.0000 | ORAL_TABLET | Freq: Two times a day (BID) | ORAL | Status: DC | PRN
  Administered 2021-01-23: 09:00:00 1 via ORAL
  Filled 2021-01-17: qty 1

## 2021-01-17 MED ORDER — MAGNESIUM OXIDE -MG SUPPLEMENT 400 (240 MG) MG PO TABS
800.0000 mg | ORAL_TABLET | ORAL | Status: AC
  Administered 2021-01-17 (×3): 800 mg via ORAL
  Filled 2021-01-17 (×3): qty 2

## 2021-01-17 NOTE — Plan of Care (Signed)

## 2021-01-17 NOTE — Consult Note (Addendum)
[name] , MD [address], [place], [place], [address] [address], [place], [place], [address] Phone: [phone]  Fax: [phone]  Consultation  Referring Provider:     DR Nelson Chimes Primary Care Physician:  Pcp, No Primary Gastroenterologist:  None          Reason for Consultation:    Cirrhosis   Date of Admission:  01/12/2021 Date of Consultation:  01/17/2021         HPI:   Joan Roman is a 70 y.o. female presented to the ER with abdominal pain and distension .   Found to have ascites with no prior diagnosis of liver .  I have been consulted to evaluate new diagnosis of liver cirrhosis.   Denies any family  history of liver disease, illegal drug use, tatoos, incarceration , excess alcohol use, blood transfusions.   Says her abdomen and legs started swelling up for the past 6 weeks, At her heaviest she weighed over 200lbs.  Past Medical History:  Diagnosis Date   Bronchitis     Past Surgical History:  Procedure Laterality Date   TONSILLECTOMY      Prior to Admission medications   Not on File    History reviewed. No pertinent family history.   Social History   Tobacco Use   Smoking status: Every Day    Types: Cigarettes   Smokeless tobacco: Never  Substance Use Topics   Alcohol use: Not Currently   Drug use: Never    Allergies as of 01/12/2021   (No Known Allergies)    Review of Systems:    All systems reviewed and negative except where noted in HPI.   Physical Exam:  Vital signs in last 24 hours: Temp:  [97.5 F (36.4 C)-98 F (36.7 C)] 98 F (36.7 C) (08/21 0714) Pulse Rate:  [62-71] 70 (08/21 0714) Resp:  [16-20] 18 (08/21 0714) BP: (86-100)/(53-64) 93/57 (08/21 0714) SpO2:  [93 %-95 %] 94 % (08/21 0714) Last BM Date: 01/14/21 General:   Pleasant, cooperative in NAD Head:  Normocephalic and atraumatic. Eyes:   No icterus.   Conjunctiva pink. PERRLA. Ears:  Normal auditory acuity. Neck:  Supple; no masses or  thyroidomegaly Lungs: Respirations even and unlabored. Lungs clear to auscultation bilaterally.   No wheezes, crackles, or rhonchi.  Heart:  Regular rate and rhythm;  Without murmur, clicks, rubs or gallops Abdomen:  Soft, distended , free fluid at lanks, brusing over abdomen Normal bowel sounds. No appreciable masses or hepatomegaly.  No rebound or guarding.  Neurologic:  Alert and oriented x3;  grossly normal neurologically.  Psych:  Alert and cooperative. Normal affect.  LAB RESULTS: Recent Labs    01/15/21 0422 01/16/21 0418 01/17/21 0542  WBC 3.9* 3.9* 3.4*  HGB 12.8 12.8 12.5  HCT 38.5 39.0 38.3  PLT 62* 65* 66*   BMET Recent Labs    01/15/21 0422 01/16/21 0418 01/17/21 0542  NA 136 136 136  K 3.2* 2.9* 3.0*  CL 95* 94* 90*  CO2 31 32 33*  GLUCOSE 95 98 91  BUN 18 16 15   CREATININE 1.18* 1.08* 0.90  CALCIUM 8.3* 8.0* 8.0*   LFT Recent Labs    01/16/21 0418  PROT 5.2*  ALBUMIN 2.8*  AST 19  ALT 6  ALKPHOS 35*  BILITOT 1.8*  BILIDIR 0.4*  IBILI 1.4*   PT/INR No results for input(s): LABPROT, INR in the last 72 hours.  STUDIES: 01/18/21 Paracentesis  Result  Date: 01/15/2021 INDICATION: Patient with history of cirrhosis, portal hypertension, recurrent ascites. Request received for therapeutic paracentesis. EXAM: ULTRASOUND GUIDED  THERAPEUTIC PARACENTESIS MEDICATIONS: None. COMPLICATIONS: None immediate. PROCEDURE: Informed written consent was obtained from the patient after a discussion of the risks, benefits and alternatives to treatment. A timeout was performed prior to the initiation of the procedure. Initial ultrasound scanning demonstrates a large amount of ascites within the left lower abdominal quadrant. The left lower abdomen was prepped and draped in the usual sterile fashion. 1% lidocaine was used for local anesthesia. Following this, a 19 gauge, 10-cm, Yueh catheter was introduced. An ultrasound image was saved for documentation purposes. The paracentesis  was performed. The catheter was removed and a dressing was applied. The patient tolerated the procedure well without immediate post procedural complication. Patient received post-procedure intravenous albumin; see nursing notes for details. FINDINGS: A total of approximately 6.8 liters of yellow fluid was removed. IMPRESSION: Successful ultrasound-guided therapeutic paracentesis yielding 6.8 liters of peritoneal fluid. Read by: Jeananne Rama, PA-C Electronically Signed   By: Olive Bass M.D.   On: 01/15/2021 16:40      Impression / Plan:   Joan Roman is a 70 y.o. y/o female presents to the ER with abdominal pain and distension . New diagnosis of ascites and liver cirrhosis seen on CT abdomen. No evidence of hepatic encephelopathy. Likely etiology could be NASH.   Plan  Low sodium diet long term < 2000mg  per day , no NSAID's Full autoimmune work up as an outpatient  Follow up viral hepatitis labs ordered Diagnostic and therapeutic paracentesis has been ordered. If > 5 L taken out then give 25 grams of 25% albumin x 1 RUQ USG to screen for HCC Q 6 monthly  I will d.c Ibuprofen on the MAR, with low potassium I will commence on aldactone 100 mg a day and when potassium returns to normal can add lasix 20 mg a day , check daily weights andBMP, can further increase dose of aldactone if BMP permits .  EGD to screen for esophageal varices as an outpatient.  6.   Folic acid low - replace 7. USG doppler to r/o portal vein thrombosis  8. Replace low potassium  9.  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 Consider thoracocentesis to evaluate for transudate vs exudate.  Thank you for involving me in the care of this patient.      LOS: 4 days   , MD  01/17/2021, 9:36 AM

## 2021-01-17 NOTE — Progress Notes (Signed)
PROGRESS NOTE    Joan Roman  ERX:[id number] DOB: [birth year] DOA: 01/12/2021 PCP: Pcp, No   Brief Narrative:  70 year old with no known past medical history admitted to the hospital for progressive bilateral lower extremity swelling for about 1 and half months, abdominal swelling found to have new onset of decompensated cirrhosis.  During hospitalization underwent large-volume paracentesis on 8/17, 8/19 complicated by soft blood pressure.  Received IV albumin and midodrine.   Assessment & Plan:   Principal Problem:   Abdominal ascites Active Problems:   Obesity, Class III, BMI 40-49.9 (morbid obesity) (HCC)   Pleural effusion   Thrombocytopenia (HCC)   Liver cirrhosis (HCC)   Ascites of liver  Decompensated liver cirrhosis with ascites Thrombocytopenia, hypoalbuminemia - Unknown exact etiology.  Acute hepatitis panel negative, TSH normal, HIV nonreactive - We will need autoimmune work-up - Echocardiogram EF 65 to 70% - Check  lipid panel. A1c 4.4 - Status post large-volume paracentesis 8/17, 8/19 thereafter receiving IV albumin.  Currently on midodrine 10 mg 3 times daily for soft blood pressure -Check coags.  No obvious evidence of bleeding at this time - GI consulted - Aldactone initiated -Liver Dopplers ordered  Hypokalemia - Repletion ordered  Trace Right sided pleural effusion - Suspect from liver cirrhosis may be a component of grade 1 diastolic dysfunction.  Vitamin D deficiency - Oral supplements  Folate deficiency Borderline vitamin B12 deficiency -Supplements have been started  Urinary tract infection with Klebsiella - Received 1 dose of IV Rocephin followed by Keflex for 3 days   DVT prophylaxis: Place TED hose Start: 01/12/21 1842 Code Status: Full Family Communication:    Status is: Inpatient  Remains inpatient appropriate because:Inpatient level of care appropriate due to severity of illness  Dispo: The patient is from: Home               Anticipated d/c is to: Home              Patient currently is not medically stable to d/c.  Still undergoing Fluid management.  Will require paracentesis in next 24-48 hours   Difficult to place patient No       Subjective: Still some abdominal fullness and lower extremity swelling but feels better since her paracentesis  Review of Systems Otherwise negative except as per HPI, including: General: Denies fever, chills, night sweats or unintended weight loss. Resp: Denies cough, wheezing, shortness of breath. Cardiac: Denies chest pain, palpitations, orthopnea, paroxysmal nocturnal dyspnea. GI: Denies abdominal pain, nausea, vomiting, diarrhea or constipation GU: Denies dysuria, frequency, hesitancy or incontinence MS: Denies muscle aches, joint pain or swelling Neuro: Denies headache, neurologic deficits (focal weakness, numbness, tingling), abnormal gait Psych: Denies anxiety, depression, SI/HI/AVH Skin: Denies new rashes or lesions ID: Denies sick contacts, exotic exposures, travel  Examination:  General exam: Appears calm and comfortable  Respiratory system: Clear to auscultation. Respiratory effort normal. Cardiovascular system: S1 & S2 heard, RRR. No JVD, murmurs, rubs, gallops or clicks. Gastrointestinal system: Abdomen is distended but nontender.  Positive fluid wave shift Central nervous system: Alert and oriented. No focal neurological deficits. Extremities: Symmetric 5 x 5 power.  1+ bilateral lower extremity pitting edema Skin: No rashes, lesions or ulcers Psychiatry: Judgement and insight appear normal. Mood & affect appropriate.     Objective: Vitals:   01/16/21 1604 01/16/21 2114 01/17/21 0619 01/17/21 0714  BP: (!) 90/59 (!) 92/54 (!) 86/53 (!) 93/57  Pulse: 62 63 70 70  Resp:  16 16 18   Temp:  97.8 F (36.6 C) (!) 97.5 F (36.4 C) 98 F (36.7 C) 98 F (36.7 C)  TempSrc: Oral   Oral  SpO2: 95% 94% 93% 94%  Weight:      Height:        Intake/Output  Summary (Last 24 hours) at 01/17/2021 0933 Last data filed at 01/17/2021 0700 Gross per 24 hour  Intake 424.59 ml  Output 1850 ml  Net -1425.41 ml   Filed Weights   01/12/21 1353 01/12/21 2111  Weight: 111.1 kg 111.6 kg     Data Reviewed:   CBC: Recent Labs  Lab 01/13/21 0621 01/14/21 0534 01/15/21 0422 01/16/21 0418 01/17/21 0542  WBC 4.2 5.4 3.9* 3.9* 3.4*  HGB 12.8 13.5 12.8 12.8 12.5  HCT 38.8 41.4 38.5 39.0 38.3  MCV 80.0 78.4* 80.0 78.2* 80.3  PLT 80* 76* 62* 65* 66*   Basic Metabolic Panel: Recent Labs  Lab 01/13/21 0621 01/13/21 1031 01/14/21 0534 01/15/21 0422 01/16/21 0418 01/17/21 0542  NA 135  --  136 136 136 136  K 4.2  --  3.3* 3.2* 2.9* 3.0*  CL 97*  --  97* 95* 94* 90*  CO2 27  --  29 31 32 33*  GLUCOSE 84  --  120* 95 98 91  BUN 19  --  19 18 16 15   CREATININE 0.93  --  1.15* 1.18* 1.08* 0.90  CALCIUM 8.4*  --  8.2* 8.3* 8.0* 8.0*  MG  --  1.9 1.7 1.7 1.5* 1.6*  PHOS  --  2.7 3.4 3.4 2.7 2.5   GFR: Estimated Creatinine Clearance: 73.2 mL/min (by C-G formula based on SCr of 0.9 mg/dL). Liver Function Tests: Recent Labs  Lab 01/12/21 1357 01/13/21 0621 01/14/21 0534 01/15/21 0422 01/16/21 0418  AST 32 25 24 19 19   ALT 9 9 9 7 6   ALKPHOS 42 41 39 37* 35*  BILITOT 2.7* 1.9* 1.6* 1.7* 1.8*  PROT 7.1 6.0* 5.7* 6.1* 5.2*  ALBUMIN 2.9* 2.6* 2.6* 3.3* 2.8*   No results for input(s): LIPASE, AMYLASE in the last 168 hours. Recent Labs  Lab 01/13/21 1031  AMMONIA 21   Coagulation Profile: No results for input(s): INR, PROTIME in the last 168 hours. Cardiac Enzymes: No results for input(s): CKTOTAL, CKMB, CKMBINDEX, TROPONINI in the last 168 hours. BNP (last 3 results) No results for input(s): PROBNP in the last 8760 hours. HbA1C: No results for input(s): HGBA1C in the last 72 hours. CBG: Recent Labs  Lab 01/16/21 0811 01/16/21 1147 01/16/21 1606 01/16/21 2116 01/17/21 0717  GLUCAP 88 92 100* 77 89   Lipid Profile: No  results for input(s): CHOL, HDL, LDLCALC, TRIG, CHOLHDL, LDLDIRECT in the last 72 hours. Thyroid Function Tests: No results for input(s): TSH, T4TOTAL, FREET4, T3FREE, THYROIDAB in the last 72 hours. Anemia Panel: No results for input(s): VITAMINB12, FOLATE, FERRITIN, TIBC, IRON, RETICCTPCT in the last 72 hours. Sepsis Labs: No results for input(s): PROCALCITON, LATICACIDVEN in the last 168 hours.  Recent Results (from the past 240 hour(s))  Resp Panel by RT-PCR (Flu A&B, Covid) Nasopharyngeal Swab     Status: None   Collection Time: 01/12/21  5:20 PM   Specimen: Nasopharyngeal Swab; Nasopharyngeal(NP) swabs in vial transport medium  Result Value Ref Range Status   SARS Coronavirus 2 by RT PCR NEGATIVE NEGATIVE Final    Comment: (NOTE) SARS-CoV-2 target nucleic acids are NOT DETECTED.  The SARS-CoV-2 RNA is generally detectable in upper respiratory specimens during the acute phase of  infection. The lowest concentration of SARS-CoV-2 viral copies this assay can detect is 138 copies/mL. A negative result does not preclude SARS-Cov-2 infection and should not be used as the sole basis for treatment or other patient management decisions. A negative result may occur with  improper specimen collection/handling, submission of specimen other than nasopharyngeal swab, presence of viral mutation(s) within the areas targeted by this assay, and inadequate number of viral copies(<138 copies/mL). A negative result must be combined with clinical observations, patient history, and epidemiological information. The expected result is Negative.  Fact Sheet for Patients:  BloggerCourse.com  Fact Sheet for Healthcare Providers:  SeriousBroker.it  This test is no t yet approved or cleared by the [place] FDA and  has been authorized for detection and/or diagnosis of SARS-CoV-2 by FDA under an Emergency Use Authorization (EUA). This EUA will remain   in effect (meaning this test can be used) for the duration of the COVID-19 declaration under Section 564(b)(1) of the Act, 21 U.S.C.section 360bbb-3(b)(1), unless the authorization is terminated  or revoked sooner.       Influenza A by PCR NEGATIVE NEGATIVE Final   Influenza B by PCR NEGATIVE NEGATIVE Final    Comment: (NOTE) The Xpert Xpress SARS-CoV-2/FLU/RSV plus assay is intended as an aid in the diagnosis of influenza from Nasopharyngeal swab specimens and should not be used as a sole basis for treatment. Nasal washings and aspirates are unacceptable for Xpert Xpress SARS-CoV-2/FLU/RSV testing.  Fact Sheet for Patients: BloggerCourse.com  Fact Sheet for Healthcare Providers: SeriousBroker.it  This test is not yet approved or cleared by the [place] FDA and has been authorized for detection and/or diagnosis of SARS-CoV-2 by FDA under an Emergency Use Authorization (EUA). This EUA will remain in effect (meaning this test can be used) for the duration of the COVID-19 declaration under Section 564(b)(1) of the Act, 21 U.S.C. section 360bbb-3(b)(1), unless the authorization is terminated or revoked.  Performed at [facility], [address], [place], [place] [address]   Peritoneal fluid culture w Gram Stain     Status: None   Collection Time: 01/12/21  7:10 PM   Specimen: Peritoneal Washings; Peritoneal Fluid  Result Value Ref Range Status   Specimen Description   Final    PERITONEAL FLUID Performed at [facility], [address], [place], [place] [address]    Special Requests   Final    NONE Performed at [facility], [address] [place], [place] [address]    Gram Stain   Final    FEW SQUAMOUS EPITHELIAL CELLS PRESENT MODERATE WBC SEEN NO ORGANISMS SEEN    Culture   Final    NO GROWTH 3 DAYS Performed at [facility], [address], [place], [place] [address]    Report  Status 01/16/2021 FINAL  Final  Urine Culture     Status: Abnormal   Collection Time: 01/13/21  8:28 AM   Specimen: Urine, Clean Catch  Result Value Ref Range Status   Specimen Description   Final    URINE, CLEAN CATCH Performed at [facility], [address], [place], [place] [address]    Special Requests   Final    NONE Performed at [facility], [address] [place], [place] [address]    Culture >=100,000 COLONIES/mL KLEBSIELLA PNEUMONIAE (A)  Final   Report Status 01/15/2021 FINAL  Final   Organism ID, Bacteria KLEBSIELLA PNEUMONIAE (A)  Final      Susceptibility  Klebsiella pneumoniae - MIC*    AMPICILLIN >=32 RESISTANT Resistant     CEFAZOLIN <=4 SENSITIVE Sensitive     CEFEPIME <=0.12 SENSITIVE Sensitive     CEFTRIAXONE <=0.25 SENSITIVE Sensitive     CIPROFLOXACIN <=0.25 SENSITIVE Sensitive     GENTAMICIN <=1 SENSITIVE Sensitive     IMIPENEM <=0.25 SENSITIVE Sensitive     NITROFURANTOIN 64 INTERMEDIATE Intermediate     TRIMETH/SULFA <=20 SENSITIVE Sensitive     AMPICILLIN/SULBACTAM 4 SENSITIVE Sensitive     PIP/TAZO <=4 SENSITIVE Sensitive     * >=100,000 COLONIES/mL KLEBSIELLA PNEUMONIAE         Radiology Studies: US Paracentesis  Result Date: 01/15/2021 INDICATION: Patient with history of cirrhosis, portal hypertension, recurrent ascites. Request received for therapeutic paracentesis. EXAM: ULTRASOUND GUIDED  THERAPEUTIC PARACENTESIS MEDICATIONS: None. COMPLICATIONS: None immediate. PROCEDURE: Informed written consent was obtained from the patient after a discussion of the risks, benefits and alternatives to treatment. A timeout was performed prior to the initiation of the procedure. Initial ultrasound scanning demonstrates a large amount of ascites within the left lower abdominal quadrant. The left lower abdomen was prepped and draped in the usual sterile fashion. 1% lidocaine was used for local anesthesia. Following this, a 19 gauge, 10-cm,  Yueh catheter was introduced. An ultrasound image was saved for documentation purposes. The paracentesis was performed. The catheter was removed and a dressing was applied. The patient tolerated the procedure well without immediate post procedural complication. Patient received post-procedure intravenous albumin; see nursing notes for details. FINDINGS: A total of approximately 6.8 liters of yellow fluid was removed. IMPRESSION: Successful ultrasound-guided therapeutic paracentesis yielding 6.8 liters of peritoneal fluid. Read by: Jeananne Rama, PA-C Electronically Signed   By: Olive Bass M.D.   On: 01/15/2021 16:40        Scheduled Meds:  cephALEXin  250 mg Oral Q6H   folic acid  1 mg Oral Daily   insulin aspart  0-5 Units Subcutaneous QHS   insulin aspart  0-9 Units Subcutaneous TID WC   lactulose  10 g Oral BID   midodrine  10 mg Oral TID WC   vitamin B-12  500 mcg Oral Daily   Vitamin D (Ergocalciferol)  50,000 Units Oral Q7 days   Continuous Infusions:  albumin human     albumin human 12.5 g (01/17/21 0539)   furosemide (LASIX) 200 mg in dextrose 5% 100 mL (2mg /mL) infusion 8 mg/hr (01/16/21 1034)     LOS: 4 days   Time spent= 35 mins    Emeterio Balke 01/18/21, MD [facility]  If 7PM-7AM, please contact night-coverage  01/17/2021, 9:33 AM

## 2021-01-18 ENCOUNTER — Inpatient Hospital Stay: Payer: Medicare Other

## 2021-01-18 DIAGNOSIS — K7460 Unspecified cirrhosis of liver: Secondary | ICD-10-CM | POA: Diagnosis not present

## 2021-01-18 DIAGNOSIS — R188 Other ascites: Secondary | ICD-10-CM | POA: Diagnosis not present

## 2021-01-18 DIAGNOSIS — J90 Pleural effusion, not elsewhere classified: Secondary | ICD-10-CM | POA: Diagnosis not present

## 2021-01-18 DIAGNOSIS — D696 Thrombocytopenia, unspecified: Secondary | ICD-10-CM | POA: Diagnosis not present

## 2021-01-18 LAB — MAGNESIUM: Magnesium: 1.8 mg/dL (ref 1.7–2.4)

## 2021-01-18 LAB — COMPREHENSIVE METABOLIC PANEL
ALT: 8 U/L (ref 0–44)
AST: 23 U/L (ref 15–41)
Albumin: 3.1 g/dL — ABNORMAL LOW (ref 3.5–5.0)
Alkaline Phosphatase: 36 U/L — ABNORMAL LOW (ref 38–126)
Anion gap: 8 (ref 5–15)
BUN: 16 mg/dL (ref 8–23)
CO2: 35 mmol/L — ABNORMAL HIGH (ref 22–32)
Calcium: 8.5 mg/dL — ABNORMAL LOW (ref 8.9–10.3)
Chloride: 95 mmol/L — ABNORMAL LOW (ref 98–111)
Creatinine, Ser: 1.02 mg/dL — ABNORMAL HIGH (ref 0.44–1.00)
GFR, Estimated: 60 mL/min — ABNORMAL LOW (ref >60)
Glucose, Bld: 95 mg/dL (ref 70–99)
Potassium: 3.6 mmol/L (ref 3.5–5.1)
Sodium: 138 mmol/L (ref 135–145)
Total Bilirubin: 2 mg/dL — ABNORMAL HIGH (ref 0.3–1.2)
Total Protein: 5.5 g/dL — ABNORMAL LOW (ref 6.5–8.1)

## 2021-01-18 LAB — PROTIME-INR
INR: 1.3 — ABNORMAL HIGH (ref 0.8–1.2)
Prothrombin Time: 16.5 seconds — ABNORMAL HIGH (ref 11.4–15.2)

## 2021-01-18 LAB — GLUCOSE, CAPILLARY
Glucose-Capillary: 86 mg/dL (ref 70–99)
Glucose-Capillary: 95 mg/dL (ref 70–99)

## 2021-01-18 MED ORDER — POTASSIUM CHLORIDE CRYS ER 20 MEQ PO TBCR
40.0000 meq | EXTENDED_RELEASE_TABLET | Freq: Once | ORAL | Status: AC
  Administered 2021-01-18: 11:00:00 40 meq via ORAL
  Filled 2021-01-18: qty 2

## 2021-01-18 MED ORDER — ZINC OXIDE 40 % EX OINT
TOPICAL_OINTMENT | CUTANEOUS | Status: DC | PRN
  Filled 2021-01-18 (×2): qty 113

## 2021-01-18 NOTE — Care Management Important Message (Signed)
Important Message  Patient Details  Name: Joan Roman MRN: [id number] Date of Birth: [birth year]   Medicare Important Message Given:  Yes     [name] 01/18/2021, 11:24 AM

## 2021-01-18 NOTE — Progress Notes (Signed)
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  The patient's stay has been keeping her potassium up wall diuresing her ascites. The patient's acute hepatitis panel was negative and her fluid did not show any signs of SBP 6 days ago.  The patient's abdominal leg swelling started approximately 6 weeks ago.  She had a Doppler ultrasound that did not show any sign of portal vein thrombosis. The patient's creatinine went up slightly from yesterday which was 0.9 up to today which was 1.02.  Objective: Vital signs in last 24 hours: Vitals:   01/18/21 0800 01/18/21 1123 01/18/21 1412 01/18/21 1638  BP: 103/61 107/70 (!) 93/59 (!) 103/59  Pulse: 62 65 69 65  Resp: 16 20  18   Temp: 98 F (36.7 C) 98.2 F (36.8 C)  97.9 F (36.6 C)  TempSrc: Oral     SpO2: 98% 95%  95%  Weight:      Height:       Weight change:   Intake/Output Summary (Last 24 hours) at 01/18/2021 1910 Last data filed at 01/18/2021 1854 Gross per 24 hour  Intake 360 ml  Output --  Net 360 ml     Exam: Heart:: Regular rate and rhythm, S1S2 present, or without murmur or extra heart sounds Lungs: normal and clear to auscultation and percussion Abdomen: Distended without tenderness without rebound without guarding   Lab Results: @LABTEST2 @ Micro Results: Recent Results (from the past 240 hour(s))  Resp Panel by RT-PCR (Flu A&B, Covid) Nasopharyngeal Swab     Status: None   Collection Time: 01/12/21  5:20 PM   Specimen: Nasopharyngeal Swab; Nasopharyngeal(NP) swabs in vial transport medium  Result Value Ref Range Status   SARS Coronavirus 2 by RT PCR NEGATIVE NEGATIVE Final    Comment: (NOTE) SARS-CoV-2 target nucleic acids are NOT DETECTED.  The SARS-CoV-2 RNA is generally detectable in upper respiratory specimens during the acute phase of infection. The  lowest concentration of SARS-CoV-2 viral copies this assay can detect is 138 copies/mL. A negative result does not preclude SARS-Cov-2 infection and should not be used as the sole basis for treatment or other patient management decisions. A negative result may occur with  improper specimen collection/handling, submission of specimen other than nasopharyngeal swab, presence of viral mutation(s) within the areas targeted by this assay, and inadequate number of viral copies(<138 copies/mL). A negative result must be combined with clinical observations, patient history, and epidemiological information. The expected result is Negative.  Fact Sheet for Patients:   Fact Sheet for Healthcare Providers:  01/14/21  This test is no t yet approved or cleared by the [employer] FDA and  has been authorized for detection and/or diagnosis of SARS-CoV-2 by FDA under an Emergency Use Authorization (EUA). This EUA will remain  in effect (meaning this test can be used) for the duration of the COVID-19 declaration under Section 564(b)(1) of the Act, 21 U.S.C.section 360bbb-3(b)(1), unless the authorization is terminated  or revoked sooner.       Influenza A by PCR NEGATIVE NEGATIVE Final   Influenza B by PCR NEGATIVE NEGATIVE Final    Comment: (NOTE) The Xpert Xpress SARS-CoV-2/FLU/RSV plus assay is intended as an aid in the diagnosis of influenza from Nasopharyngeal swab specimens and should not be used as a sole basis for treatment. Nasal washings and aspirates are unacceptable for Xpert  Xpress SARS-CoV-2/FLU/RSV testing.  Fact Sheet for Patients: BloggerCourse.com  Fact Sheet for Healthcare Providers: SeriousBroker.it  This test is not yet approved or cleared by the [place] FDA and has been authorized for detection and/or diagnosis of SARS-CoV-2 by FDA under  an Emergency Use Authorization (EUA). This EUA will remain in effect (meaning this test can be used) for the duration of the COVID-19 declaration under Section 564(b)(1) of the Act, 21 U.S.C. section 360bbb-3(b)(1), unless the authorization is terminated or revoked.  Performed at [facility], [address] [place], [place] [address]   Peritoneal fluid culture w Gram Stain     Status: None   Collection Time: 01/12/21  7:10 PM   Specimen: Peritoneal Washings; Peritoneal Fluid  Result Value Ref Range Status   Specimen Description   Final    PERITONEAL FLUID Performed at [facility], [address], [place], [place] [address]    Special Requests   Final    NONE Performed at [facility], [address] [place], [place] [address]    Gram Stain   Final    FEW SQUAMOUS EPITHELIAL CELLS PRESENT MODERATE WBC SEEN NO ORGANISMS SEEN    Culture   Final    NO GROWTH 3 DAYS Performed at [facility], [address] [place], [place] [address]    Report Status 01/16/2021 FINAL  Final  Urine Culture     Status: Abnormal   Collection Time: 01/13/21  8:28 AM   Specimen: Urine, Clean Catch  Result Value Ref Range Status   Specimen Description   Final    URINE, CLEAN CATCH Performed at [facility], [address], [place], [place] [address]    Special Requests   Final    NONE Performed at [facility], [address] [place], [place] [address]    Culture >=100,000 COLONIES/mL KLEBSIELLA PNEUMONIAE (A)  Final   Report Status 01/15/2021 FINAL  Final   Organism ID, Bacteria KLEBSIELLA PNEUMONIAE (A)  Final      Susceptibility   Klebsiella pneumoniae - MIC*    AMPICILLIN >=32 RESISTANT Resistant     CEFAZOLIN <=4 SENSITIVE Sensitive     CEFEPIME <=0.12 SENSITIVE Sensitive     CEFTRIAXONE <=0.25 SENSITIVE Sensitive     CIPROFLOXACIN <=0.25 SENSITIVE Sensitive     GENTAMICIN <=1 SENSITIVE Sensitive     IMIPENEM <=0.25 SENSITIVE  Sensitive     NITROFURANTOIN 64 INTERMEDIATE Intermediate     TRIMETH/SULFA <=20 SENSITIVE Sensitive     AMPICILLIN/SULBACTAM 4 SENSITIVE Sensitive     PIP/TAZO <=4 SENSITIVE Sensitive     * >=100,000 COLONIES/mL KLEBSIELLA PNEUMONIAE   Studies/Results: US LIVER DOPPLER  Result Date: 01/18/2021 CLINICAL DATA:  Cirrhosis EXAM: DUPLEX ULTRASOUND OF LIVER TECHNIQUE: Color and duplex Doppler ultrasound was performed to evaluate the hepatic in-flow and out-flow vessels. COMPARISON:  CT 01/12/2021 FINDINGS: Liver: Coarse echotexture without focal lesion. Nodular hepatic contour. No focal lesion, mass or intrahepatic biliary ductal dilatation. Main Portal Vein size: 1.4 cm Portal Vein Velocities (all hepatopetal): Main Prox:  28 cm/sec Main Mid: 31 cm/sec Main Dist:  22 cm/sec Right: 20 cm/sec Left: 13 cm/sec Hepatic Vein Velocities (all hepatofugal): Right:  14 cm/sec Middle:  43 cm/sec Left:  27 cm/sec IVC: Present and patent with normal respiratory phasicity. Velocity 47 cm/sec Hepatic Artery Velocity:  49 cm/sec Splenic Vein Velocity:  21 cm/sec Spleen: 19 cm x 6 cm x 13 cm with a total volume of 739  cm^3 (411 cm^3 is upper limit normal) Portal Vein Occlusion/Thrombus: No Splenic Vein Occlusion/Thrombus: No Ascites: Present, mild Varices: None Recanalized umbilical vein incidentally noted. Gallbladder wall thickening up to 8 mm. IMPRESSION: 1. Unremarkable hepatic vascular Doppler evaluation. 2. Nodular hepatic contour suggesting cirrhosis, with stigmata of portal venous hypertension including splenomegaly and ascites. 3. Nonspecific gallbladder wall thickening. Electronically Signed   By: Corlis Leak M.D.   On: 01/18/2021 13:25   Medications: I have reviewed the patient's current medications. Scheduled Meds:  cephALEXin  250 mg Oral Q6H   folic acid  1 mg Oral Daily   midodrine  10 mg Oral TID WC   spironolactone  100 mg Oral Daily   vitamin B-12  500 mcg Oral Daily   Vitamin D (Ergocalciferol)   50,000 Units Oral Q7 days   Continuous Infusions:  albumin human     PRN Meds:.albumin human, dextromethorphan-guaiFENesin, liver oil-zinc oxide, ondansetron **OR** ondansetron (ZOFRAN) IV, senna-docusate, simethicone   Assessment: Principal Problem:   Abdominal ascites Active Problems:   Obesity, Class III, BMI 40-49.9 (morbid obesity) (HCC)   Pleural effusion   Thrombocytopenia (HCC)   Liver cirrhosis (HCC)   Ascites of liver    Plan: This patient has cirrhosis with portal hypertension and ascites and recurrent paracentesis for ascites.  The patient has been put on Aldactone with her potassium coming up from 2.9 two days ago to 3.6 this morning.  If her potassium continues to stay up then she may have her Lasix restarted.  This is with the consideration that her blood pressure maintains a adequate level.  No other source for the patient's liver failure was seen. The patient has been explained the plan and agrees with it.   LOS: 5 days   Sherlyn Hay 01/18/2021, 7:10 PM Pager [phone] 7am-5pm  Check AMION for 5pm -7am coverage and on weekends

## 2021-01-18 NOTE — Progress Notes (Signed)
PROGRESS NOTE    Joan Roman  FXT:[id number] DOB: 11/12/1950 DOA: 01/12/2021 PCP: Pcp, No   Brief Narrative:  70 year old with no known past medical history admitted to the hospital for progressive bilateral lower extremity swelling for about 1 and half months, abdominal swelling found to have new onset of decompensated cirrhosis.  During hospitalization underwent large-volume paracentesis on 8/17, 8/19 complicated by soft blood pressure.  Received IV albumin and midodrine.  GI team was consulted, Lasix drip was discontinued and and started oral Aldactone   Assessment & Plan:   Principal Problem:   Abdominal ascites Active Problems:   Obesity, Class III, BMI 40-49.9 (morbid obesity) (HCC)   Pleural effusion   Thrombocytopenia (HCC)   Liver cirrhosis (HCC)   Ascites of liver  Decompensated liver cirrhosis with ascites Thrombocytopenia, hypoalbuminemia - Unknown exact etiology.  Acute hepatitis panel negative, TSH normal, HIV nonreactive.  Will defer autoimmune work-up to GI - Echocardiogram EF 65 to 70% - Lipid panel-okay A1c 4.4 - Status post large-volume paracentesis 8/17, 8/19 thereafter receiving IV albumin.  Currently on midodrine 10 mg 3 times daily for soft blood pressure - Coag panel is pending - Aldactone initiated.  Liver Dopplers-pending  Hypokalemia - Repletion ordered  Trace Right sided pleural effusion - Suspect from liver cirrhosis may be a component of grade 1 diastolic dysfunction.  Vitamin D deficiency - Oral supplements  Folate deficiency Borderline vitamin B12 deficiency -Supplements have been started  Urinary tract infection with Klebsiella - Received 1 dose of IV Rocephin followed by Keflex for 3 days   DVT prophylaxis: Place TED hose Start: 01/12/21 1842 Code Status: Full Family Communication:    Status is: Inpatient  Remains inpatient appropriate because:Inpatient level of care appropriate due to severity of illness  Dispo: The  patient is from: Home              Anticipated d/c is to: Home              Patient currently is not medically stable to d/c.  Still undergoing Fluid management.  Will require paracentesis in next 24-48 hours   Difficult to place patient No       Subjective: Patient seen and examined at bedside, reports of watery stool over the last 24 hours.  Abdomen slightly distended but nowhere close to where it was during earlier in her admission.  Review of Systems Otherwise negative except as per HPI, including: General: Denies fever, chills, night sweats or unintended weight loss. Resp: Denies cough, wheezing, shortness of breath. Cardiac: Denies chest pain, palpitations, orthopnea, paroxysmal nocturnal dyspnea. GI: Denies abdominal pain, nausea, vomiting, constipation GU: Denies dysuria, frequency, hesitancy or incontinence MS: Denies muscle aches, joint pain or swelling Neuro: Denies headache, neurologic deficits (focal weakness, numbness, tingling), abnormal gait Psych: Denies anxiety, depression, SI/HI/AVH Skin: Denies new rashes or lesions ID: Denies sick contacts, exotic exposures, travel  Examination: Constitutional: Not in acute distress Respiratory: Clear to auscultation bilaterally Cardiovascular: Normal sinus rhythm, no rubs Abdomen: Abdomen is distended with positive fluid wave shift Musculoskeletal: 1+ bilateral lower extremity pitting edema Skin: No rashes seen Neurologic: CN 2-12 grossly intact.  And nonfocal Psychiatric: Normal judgment and insight. Alert and oriented x 3. Normal mood.   Objective: Vitals:   01/17/21 1145 01/17/21 2044 01/18/21 0035 01/18/21 0544  BP: (!) 99/56 (!) 90/54 (!) 96/54 (!) 92/52  Pulse: 64 (!) 59 66 70  Resp: 16 18 16 16   Temp: 98 F (36.7 C) 98.8 F (37.1  C) (!) 97.4 F (36.3 C) 98.2 F (36.8 C)  TempSrc: Oral  Oral Oral  SpO2: 98% 94% (!) 89% 92%  Weight:      Height:        Intake/Output Summary (Last 24 hours) at 01/18/2021  0727 Last data filed at 01/17/2021 1900 Gross per 24 hour  Intake 261.57 ml  Output 500 ml  Net -238.43 ml   Filed Weights   01/12/21 1353 01/12/21 2111  Weight: 111.1 kg 111.6 kg     Data Reviewed:   CBC: Recent Labs  Lab 01/13/21 0621 01/14/21 0534 01/15/21 0422 01/16/21 0418 01/17/21 0542  WBC 4.2 5.4 3.9* 3.9* 3.4*  HGB 12.8 13.5 12.8 12.8 12.5  HCT 38.8 41.4 38.5 39.0 38.3  MCV 80.0 78.4* 80.0 78.2* 80.3  PLT 80* 76* 62* 65* 66*   Basic Metabolic Panel: Recent Labs  Lab 01/13/21 1031 01/14/21 0534 01/15/21 0422 01/16/21 0418 01/17/21 0542 01/18/21 0555  NA  --  136 136 136 136 138  K  --  3.3* 3.2* 2.9* 3.0* 3.6  CL  --  97* 95* 94* 90* 95*  CO2  --  29 31 32 33* 35*  GLUCOSE  --  120* 95 98 91 95  BUN  --  19 18 16 15 16   CREATININE  --  1.15* 1.18* 1.08* 0.90 1.02*  CALCIUM  --  8.2* 8.3* 8.0* 8.0* 8.5*  MG 1.9 1.7 1.7 1.5* 1.6* 1.8  PHOS 2.7 3.4 3.4 2.7 2.5  --    GFR: Estimated Creatinine Clearance: 64.6 mL/min (A) (by C-G formula based on SCr of 1.02 mg/dL (H)). Liver Function Tests: Recent Labs  Lab 01/13/21 0621 01/14/21 0534 01/15/21 0422 01/16/21 0418 01/18/21 0555  AST 25 24 19 19 23   ALT 9 9 7 6 8   ALKPHOS 41 39 37* 35* 36*  BILITOT 1.9* 1.6* 1.7* 1.8* 2.0*  PROT 6.0* 5.7* 6.1* 5.2* 5.5*  ALBUMIN 2.6* 2.6* 3.3* 2.8* 3.1*   No results for input(s): LIPASE, AMYLASE in the last 168 hours. Recent Labs  Lab 01/13/21 1031  AMMONIA 21   Coagulation Profile: No results for input(s): INR, PROTIME in the last 168 hours. Cardiac Enzymes: No results for input(s): CKTOTAL, CKMB, CKMBINDEX, TROPONINI in the last 168 hours. BNP (last 3 results) No results for input(s): PROBNP in the last 8760 hours. HbA1C: No results for input(s): HGBA1C in the last 72 hours. CBG: Recent Labs  Lab 01/16/21 1606 01/16/21 2116 01/17/21 0717 01/17/21 1146 01/17/21 2026  GLUCAP 100* 77 89 103* 101*   Lipid Profile: Recent Labs    01/17/21 0542   CHOL 54  HDL 13*  LDLCALC 28  TRIG 67  CHOLHDL 4.2   Thyroid Function Tests: No results for input(s): TSH, T4TOTAL, FREET4, T3FREE, THYROIDAB in the last 72 hours. Anemia Panel: No results for input(s): VITAMINB12, FOLATE, FERRITIN, TIBC, IRON, RETICCTPCT in the last 72 hours. Sepsis Labs: No results for input(s): PROCALCITON, LATICACIDVEN in the last 168 hours.  Recent Results (from the past 240 hour(s))  Resp Panel by RT-PCR (Flu A&B, Covid) Nasopharyngeal Swab     Status: None   Collection Time: 01/12/21  5:20 PM   Specimen: Nasopharyngeal Swab; Nasopharyngeal(NP) swabs in vial transport medium  Result Value Ref Range Status   SARS Coronavirus 2 by RT PCR NEGATIVE NEGATIVE Final    Comment: (NOTE) SARS-CoV-2 target nucleic acids are NOT DETECTED.  The SARS-CoV-2 RNA is generally detectable in upper respiratory specimens during the  acute phase of infection. The lowest concentration of SARS-CoV-2 viral copies this assay can detect is 138 copies/mL. A negative result does not preclude SARS-Cov-2 infection and should not be used as the sole basis for treatment or other patient management decisions. A negative result may occur with  improper specimen collection/handling, submission of specimen other than nasopharyngeal swab, presence of viral mutation(s) within the areas targeted by this assay, and inadequate number of viral copies(<138 copies/mL). A negative result must be combined with clinical observations, patient history, and epidemiological information. The expected result is Negative.  Fact Sheet for Patients:  BloggerCourse.com  Fact Sheet for Healthcare Providers:  SeriousBroker.it  This test is no t yet approved or cleared by the [place] FDA and  has been authorized for detection and/or diagnosis of SARS-CoV-2 by FDA under an Emergency Use Authorization (EUA). This EUA will remain  in effect (meaning this  test can be used) for the duration of the COVID-19 declaration under Section 564(b)(1) of the Act, 21 U.S.C.section 360bbb-3(b)(1), unless the authorization is terminated  or revoked sooner.       Influenza A by PCR NEGATIVE NEGATIVE Final   Influenza B by PCR NEGATIVE NEGATIVE Final    Comment: (NOTE) The Xpert Xpress SARS-CoV-2/FLU/RSV plus assay is intended as an aid in the diagnosis of influenza from Nasopharyngeal swab specimens and should not be used as a sole basis for treatment. Nasal washings and aspirates are unacceptable for Xpert Xpress SARS-CoV-2/FLU/RSV testing.  Fact Sheet for Patients: BloggerCourse.com  Fact Sheet for Healthcare Providers: SeriousBroker.it  This test is not yet approved or cleared by the [place] FDA and has been authorized for detection and/or diagnosis of SARS-CoV-2 by FDA under an Emergency Use Authorization (EUA). This EUA will remain in effect (meaning this test can be used) for the duration of the COVID-19 declaration under Section 564(b)(1) of the Act, 21 U.S.C. section 360bbb-3(b)(1), unless the authorization is terminated or revoked.  Performed at [facility], [address], [place], [place] [address]   Peritoneal fluid culture w Gram Stain     Status: None   Collection Time: 01/12/21  7:10 PM   Specimen: Peritoneal Washings; Peritoneal Fluid  Result Value Ref Range Status   Specimen Description   Final    PERITONEAL FLUID Performed at [facility], [address] [place], [place] [address]    Special Requests   Final    NONE Performed at [facility], [address] [place], [place] [address]    Gram Stain   Final    FEW SQUAMOUS EPITHELIAL CELLS PRESENT MODERATE WBC SEEN NO ORGANISMS SEEN    Culture   Final    NO GROWTH 3 DAYS Performed at [facility], [address] [place], [place] [address]    Report Status 01/16/2021 FINAL   Final  Urine Culture     Status: Abnormal   Collection Time: 01/13/21  8:28 AM   Specimen: Urine, Clean Catch  Result Value Ref Range Status   Specimen Description   Final    URINE, CLEAN CATCH Performed at [facility], [address] [place], [place] [address]    Special Requests   Final    NONE Performed at [facility], [address] [place], [place] [address]    Culture >=100,000 COLONIES/mL KLEBSIELLA PNEUMONIAE (A)  Final   Report Status 01/15/2021 FINAL  Final   Organism ID, Bacteria KLEBSIELLA PNEUMONIAE (A)  Final  Susceptibility   Klebsiella pneumoniae - MIC*    AMPICILLIN >=32 RESISTANT Resistant     CEFAZOLIN <=4 SENSITIVE Sensitive     CEFEPIME <=0.12 SENSITIVE Sensitive     CEFTRIAXONE <=0.25 SENSITIVE Sensitive     CIPROFLOXACIN <=0.25 SENSITIVE Sensitive     GENTAMICIN <=1 SENSITIVE Sensitive     IMIPENEM <=0.25 SENSITIVE Sensitive     NITROFURANTOIN 64 INTERMEDIATE Intermediate     TRIMETH/SULFA <=20 SENSITIVE Sensitive     AMPICILLIN/SULBACTAM 4 SENSITIVE Sensitive     PIP/TAZO <=4 SENSITIVE Sensitive     * >=100,000 COLONIES/mL KLEBSIELLA PNEUMONIAE         Radiology Studies: No results found.      Scheduled Meds:  cephALEXin  250 mg Oral Q6H   folic acid  1 mg Oral Daily   insulin aspart  0-5 Units Subcutaneous QHS   insulin aspart  0-9 Units Subcutaneous TID WC   lactulose  10 g Oral BID   midodrine  10 mg Oral TID WC   spironolactone  100 mg Oral Daily   vitamin B-12  500 mcg Oral Daily   Vitamin D (Ergocalciferol)  50,000 Units Oral Q7 days   Continuous Infusions:  albumin human       LOS: 5 days   Time spent= 35 mins    Kaelee Pfeffer Joline Maxcy, MD [facility]  If 7PM-7AM, please contact night-coverage  01/18/2021, 7:27 AM

## 2021-01-19 ENCOUNTER — Inpatient Hospital Stay: Payer: Medicare Other

## 2021-01-19 DIAGNOSIS — R188 Other ascites: Secondary | ICD-10-CM | POA: Diagnosis not present

## 2021-01-19 DIAGNOSIS — J90 Pleural effusion, not elsewhere classified: Secondary | ICD-10-CM | POA: Diagnosis not present

## 2021-01-19 DIAGNOSIS — K7460 Unspecified cirrhosis of liver: Secondary | ICD-10-CM | POA: Diagnosis not present

## 2021-01-19 DIAGNOSIS — D696 Thrombocytopenia, unspecified: Secondary | ICD-10-CM | POA: Diagnosis not present

## 2021-01-19 LAB — COMPREHENSIVE METABOLIC PANEL
ALT: 8 U/L (ref 0–44)
AST: 26 U/L (ref 15–41)
Albumin: 2.9 g/dL — ABNORMAL LOW (ref 3.5–5.0)
Alkaline Phosphatase: 41 U/L (ref 38–126)
Anion gap: 10 (ref 5–15)
BUN: 18 mg/dL (ref 8–23)
CO2: 34 mmol/L — ABNORMAL HIGH (ref 22–32)
Calcium: 8.6 mg/dL — ABNORMAL LOW (ref 8.9–10.3)
Chloride: 93 mmol/L — ABNORMAL LOW (ref 98–111)
Creatinine, Ser: 1.03 mg/dL — ABNORMAL HIGH (ref 0.44–1.00)
GFR, Estimated: 59 mL/min — ABNORMAL LOW (ref >60)
Glucose, Bld: 112 mg/dL — ABNORMAL HIGH (ref 70–99)
Potassium: 3.4 mmol/L — ABNORMAL LOW (ref 3.5–5.1)
Sodium: 137 mmol/L (ref 135–145)
Total Bilirubin: 1.4 mg/dL — ABNORMAL HIGH (ref 0.3–1.2)
Total Protein: 5.5 g/dL — ABNORMAL LOW (ref 6.5–8.1)

## 2021-01-19 LAB — BODY FLUID CELL COUNT WITH DIFFERENTIAL
Eos, Fluid: 0 %
Lymphs, Fluid: 29 %
Monocyte-Macrophage-Serous Fluid: 70 %
Neutrophil Count, Fluid: 1 %
Total Nucleated Cell Count, Fluid: 288 cu mm

## 2021-01-19 LAB — MAGNESIUM: Magnesium: 1.9 mg/dL (ref 1.7–2.4)

## 2021-01-19 MED ORDER — ALBUMIN HUMAN 25 % IV SOLN
25.0000 g | Freq: Four times a day (QID) | INTRAVENOUS | Status: AC
Start: 2021-01-19 — End: 2021-01-20
  Administered 2021-01-19 – 2021-01-20 (×3): 25 g via INTRAVENOUS
  Filled 2021-01-19 (×3): qty 100

## 2021-01-19 MED ORDER — CHLORHEXIDINE GLUCONATE CLOTH 2 % EX PADS
6.0000 | MEDICATED_PAD | Freq: Every day | CUTANEOUS | Status: DC
  Administered 2021-01-20 – 2021-01-23 (×3): 6 via TOPICAL

## 2021-01-19 MED ORDER — POTASSIUM CHLORIDE CRYS ER 20 MEQ PO TBCR
40.0000 meq | EXTENDED_RELEASE_TABLET | Freq: Once | ORAL | Status: AC
  Administered 2021-01-19: 08:00:00 40 meq via ORAL
  Filled 2021-01-19: qty 2

## 2021-01-19 MED ORDER — NYSTATIN 100000 UNIT/GM EX POWD
Freq: Three times a day (TID) | CUTANEOUS | Status: DC
  Filled 2021-01-19: qty 15

## 2021-01-19 NOTE — TOC Progression Note (Signed)
Transition of Care [facility]) - Progression Note    Patient Details  Name: Joan Roman MRN: [id number] Date of Birth: 1950-12-22  Transition of Care [facility] CM/SW Contact  Allayne Butcher, RN Phone Number: 01/19/2021, 12:35 PM  Clinical Narrative:    Patient still undergoing care for fluid management and another paracentesis has been ordered.  TOC continues to follow for discharge planning.  PT and OT consults pending.  Patient agreeable to home health if needed at discharge.     Expected Discharge Plan: Home/Self Care Barriers to Discharge: Continued Medical Work up  Expected Discharge Plan and Services Expected Discharge Plan: Home/Self Care   Discharge Planning Services: CM Consult   Living arrangements for the past 2 months: Single Family Home                 DME Arranged: N/A DME Agency: NA                   Social Determinants of Health (SDOH) Interventions    Readmission Risk Interventions No flowsheet data found.

## 2021-01-19 NOTE — Progress Notes (Signed)
PT Cancellation Note  Patient Details Name: Joan Roman MRN: [id number] DOB: 14-Dec-1950   Cancelled Treatment:    Reason Eval/Treat Not Completed: Other (comment) Attempted to see pt a second time this afternoon.  She was pleasant but reports she was not only a little tired from working with OT earlier but that she had yet another BM (reports all day she has had BMs every 10-30 minutes).  PT again notified nurse that pt will need assist with clean up, deferred PT this date per pt request, she may be getting a rectal tube which would be beneficial with her willingness to try and get up and move w/o fear of further BM messes. Will maintain on caseload and try back tomorrow as appropriate.  Malachi Pro, DPT 01/19/2021, 3:23 PM

## 2021-01-19 NOTE — Evaluation (Signed)
Occupational Therapy Evaluation Patient Details Name: Joan Roman MRN: [id number] DOB: 01-20-1951 Today's Date: 01/19/2021    History of Present Illness 70 year old with no known past medical history admitted to the hospital for progressive bilateral lower extremity swelling for about 1 and half months, abdominal swelling found to have new onset of decompensated cirrhosis.  During hospitalization underwent large-volume paracentesis on 8/17, 8/19 complicated by soft blood pressure.   Clinical Impression   Pt was seen for OT evaluation this date. Prior to hospital admission, pt was independent, until last couple weeks when she began experiencing increased swelling in BLE and abdomen making it increasingly difficult to walk, stand, and perform transfers. Pt lives alone in a 2 story home with 1+1 steps to enter and all needs on main floor of the home. Pt endorses family do not live locally. Pt received in bed, had loose BM while answering PLOF questions. Pt endorses very little notice and control when she has BMs, which are occurring several times per hour. Pt performed bed mobility with modified independence and able to maintain sidelying. She was unable to perform pericare 2/2 body habitus with significant abdominal swelling. Currently pt demonstrates impairments as described below (See OT problem list) which functionally limit her ability to perform ADL/self-care tasks. Pt currently requires MAX A for LB ADL and toileting from bed level. Pt politely declined EOB/OOB attempts 2/2 loose BMs. Pt educated in AE/DME for bathing, dressing, and toileting to maximize safety/indep. Pt verbalized understanding and would benefit from skilled OT services to address noted impairments and functional limitations (see below for any additional details) in order to maximize safety and independence while minimizing falls risk and caregiver burden. Upon hospital discharge, recommend STR to maximize pt safety and return  to PLOF.     Follow Up Recommendations  SNF    Equipment Recommendations  3 in 1 bedside commode;Tub/shower seat    Recommendations for Other Services       Precautions / Restrictions Precautions Precautions: Fall Restrictions Weight Bearing Restrictions: No      Mobility Bed Mobility Overal bed mobility: Needs Assistance Bed Mobility: Rolling Rolling: Modified independent (Device/Increase time)         General bed mobility comments: increased time/effort to perform with use of bed rails to assist, able to maintain sidelying for pericare    Transfers                 General transfer comment: NT, pt declined 2/2 frequent loose stools    Balance                                           ADL either performed or assessed with clinical judgement   ADL Overall ADL's : Needs assistance/impaired                                       General ADL Comments: Pt currently requires MAX A for pericare in bed level (unable to reach due to very swollen abdomen), MAX A for bed level LB bathing and dressing.     Vision         Perception     Praxis      Pertinent Vitals/Pain Pain Assessment: 0-10 Pain Score: 8  Pain Location: abdomen Pain Descriptors / Indicators: Aching Pain Intervention(s):  Limited activity within patient's tolerance;Monitored during session;Premedicated before session     Hand Dominance Right   Extremity/Trunk Assessment Upper Extremity Assessment Upper Extremity Assessment: Generalized weakness   Lower Extremity Assessment Lower Extremity Assessment: Generalized weakness       Communication Communication Communication: No difficulties   Cognition Arousal/Alertness: Awake/alert Behavior During Therapy: WFL for tasks assessed/performed Overall Cognitive Status: Within Functional Limits for tasks assessed                                 General Comments: talkative, cues to  redirect   General Comments  During pericare, pt perineal area noted to be red and very sore to touch with hygiene efforts. Barrier cream applied per pt request that was present in room and which pt reports nursing has been using. RN notified.    Exercises Other Exercises Other Exercises: Pt educated in benefits of mobility efforts and role of OT to support ADL safety/independence. Pt also educated in AE/DME for bathing and toileting.   Shoulder Instructions      Home Living Family/patient expects to be discharged to:: Private residence Living Arrangements: Alone Available Help at Discharge: Family;Available PRN/intermittently (brothers live out of town) Type of Home: House Home Access: Stairs to enter [employer] of Steps: 1+1   Home Layout: Two level;Able to live on main level with bedroom/bathroom   Alternate Level Stairs-Rails: None Bathroom Shower/Tub: Producer, television/film/video: Standard     Home Equipment: None          Prior Functioning/Environment Level of Independence: Independent        Comments: Pt independent in all aspects until she reports having increased BLE swelling, making it increasingly difficult to walk, stand, and perform car transfers, leading to this admission.        OT Problem List: Decreased strength;Impaired balance (sitting and/or standing);Decreased knowledge of use of DME or AE;Pain;Increased edema      OT Treatment/Interventions: Self-care/ADL training;Therapeutic exercise;Therapeutic activities;DME and/or AE instruction;Patient/family education;Balance training    OT Goals(Current goals can be found in the care plan section) Acute Rehab OT Goals Patient Stated Goal: get stronger and then go home OT Goal Formulation: With patient Time For Goal Achievement: 02/02/21 Potential to Achieve Goals: Good ADL Goals Pt Will Perform Lower Body Dressing: with modified independence;with adaptive equipment;sit to/from stand Pt  Will Transfer to Toilet: with modified independence;ambulating;bedside commode (LRAD for amb) Additional ADL Goal #1: Pt will perform daily morning ADL routine requiring increased time/effort and modifications to support safety/indep.  OT Frequency: Min 2X/week   Barriers to D/C:            Co-evaluation              AM-PAC OT "6 Clicks" Daily Activity     Outcome Measure Help from another person eating meals?: None Help from another person taking care of personal grooming?: None Help from another person toileting, which includes using toliet, bedpan, or urinal?: A Lot Help from another person bathing (including washing, rinsing, drying)?: A Lot Help from another person to put on and taking off regular upper body clothing?: A Little Help from another person to put on and taking off regular lower body clothing?: A Lot 6 Click Score: 17   End of Session Nurse Communication: Other (comment) (BM)  Activity Tolerance: Patient tolerated treatment well Patient left: in bed;with call bell/phone within reach;with bed alarm set  OT  Visit Diagnosis: Other abnormalities of gait and mobility (R26.89);Muscle weakness (generalized) (M62.81);Pain Pain - Right/Left: Right Pain - part of body:  (abdomen)                Time: 6789-3810 OT Time Calculation (min): 33 min Charges:  OT General Charges $OT Visit: 1 Visit OT Evaluation $OT Eval Moderate Complexity: 1 Mod OT Treatments $Self Care/Home Management : 23-37 mins  Wynona Canes, MPH, MS, OTR/L ascom [phone] 01/19/21, 2:47 PM

## 2021-01-19 NOTE — Progress Notes (Signed)
PT Cancellation Note  Patient Details Name: Joan Roman MRN: [id number] DOB: 1951/01/20   Cancelled Treatment:    Reason Eval/Treat Not Completed: Other (comment) Chart reviewed, attempted to see pt this AM.  She was pleasant but reports that she had yet another loose BM and needed to be cleaned up, nursing informed.  Will try back later as time allows.  Malachi Pro, DPT 01/19/2021, 11:48 AM

## 2021-01-19 NOTE — Progress Notes (Signed)
Joan Minium, MD [facility]   [address] [place], [place] [address] Phone: [phone] Fax : [phone]   Subjective: The patient continues to report that she has had diarrhea for many months.  She had a repeat paracentesis today.  The patient's creatinine is slightly elevated above normal.  There is no report of any abdominal pain.  Objective: Vital signs in last 24 hours: Vitals:   01/19/21 1614 01/19/21 1624 01/19/21 1629 01/19/21 1640  BP: 105/62 107/62 (!) 82/68 97/63  Pulse: 65 63 67 66  Resp: 18 18 18    Temp:      TempSrc:      SpO2: 96% 96% 100% 99%  Weight:      Height:       Weight change:   Intake/Output Summary (Last 24 hours) at 01/19/2021 1739 Last data filed at 01/19/2021 1013 Gross per 24 hour  Intake 420 ml  Output --  Net 420 ml     Exam: Heart:: Regular rate and rhythm, S1S2 present, or without murmur or extra heart sounds Lungs: normal and clear to auscultation and percussion Abdomen: Distended with shifting dullness and fluid wave   Lab Results: @LABTEST2 @ Micro Results: Recent Results (from the past 240 hour(s))  Resp Panel by RT-PCR (Flu A&B, Covid) Nasopharyngeal Swab     Status: None   Collection Time: 01/12/21  5:20 PM   Specimen: Nasopharyngeal Swab; Nasopharyngeal(NP) swabs in vial transport medium  Result Value Ref Range Status   SARS Coronavirus 2 by RT PCR NEGATIVE NEGATIVE Final    Comment: (NOTE) SARS-CoV-2 target nucleic acids are NOT DETECTED.  The SARS-CoV-2 RNA is generally detectable in upper respiratory specimens during the acute phase of infection. The lowest concentration of SARS-CoV-2 viral copies this assay can detect is 138 copies/mL. A negative result does not preclude SARS-Cov-2 infection and should not be used as the sole basis for treatment or other patient management decisions. A negative result may occur with  improper specimen collection/handling, submission of specimen other than nasopharyngeal swab,  presence of viral mutation(s) within the areas targeted by this assay, and inadequate number of viral copies(<138 copies/mL). A negative result must be combined with clinical observations, patient history, and epidemiological information. The expected result is Negative.  Fact Sheet for Patients:   Fact Sheet for Healthcare Providers:  01/14/21  This test is no t yet approved or cleared by the [employer] FDA and  has been authorized for detection and/or diagnosis of SARS-CoV-2 by FDA under an Emergency Use Authorization (EUA). This EUA will remain  in effect (meaning this test can be used) for the duration of the COVID-19 declaration under Section 564(b)(1) of the Act, 21 U.S.C.section 360bbb-3(b)(1), unless the authorization is terminated  or revoked sooner.       Influenza A by PCR NEGATIVE NEGATIVE Final   Influenza B by PCR NEGATIVE NEGATIVE Final    Comment: (NOTE) The Xpert Xpress SARS-CoV-2/FLU/RSV plus assay is intended as an aid in the diagnosis of influenza from Nasopharyngeal swab specimens and should not be used as a sole basis for treatment. Nasal washings and aspirates are unacceptable for Xpert Xpress SARS-CoV-2/FLU/RSV testing.  Fact Sheet for Patients: SeriousBroker.it  Fact Sheet for Healthcare Providers: [place]  This test is not yet approved or cleared by the [employer] FDA and has been authorized for detection and/or diagnosis of SARS-CoV-2 by FDA under an Emergency Use Authorization (EUA). This EUA will remain in effect (meaning this test can be used) for  the duration of the COVID-19 declaration under Section 564(b)(1) of the Act, 21 U.S.C. section 360bbb-3(b)(1), unless the authorization is terminated or revoked.  Performed at [facility], [address] [place], [place] [address]   Peritoneal  fluid culture w Gram Stain     Status: None   Collection Time: 01/12/21  7:10 PM   Specimen: Peritoneal Washings; Peritoneal Fluid  Result Value Ref Range Status   Specimen Description   Final    PERITONEAL FLUID Performed at [facility], [address] [place], [place] [address]    Special Requests   Final    NONE Performed at [facility], [address] [place], [place] [address]    Gram Stain   Final    FEW SQUAMOUS EPITHELIAL CELLS PRESENT MODERATE WBC SEEN NO ORGANISMS SEEN    Culture   Final    NO GROWTH 3 DAYS Performed at [facility], [address] [place], [place] [address]    Report Status 01/16/2021 FINAL  Final  Urine Culture     Status: Abnormal   Collection Time: 01/13/21  8:28 AM   Specimen: Urine, Clean Catch  Result Value Ref Range Status   Specimen Description   Final    URINE, CLEAN CATCH Performed at [facility], [address], [place], [place] [address]    Special Requests   Final    NONE Performed at [facility], [address] [place], [place] [address]    Culture >=100,000 COLONIES/mL KLEBSIELLA PNEUMONIAE (A)  Final   Report Status 01/15/2021 FINAL  Final   Organism ID, Bacteria KLEBSIELLA PNEUMONIAE (A)  Final      Susceptibility   Klebsiella pneumoniae - MIC*    AMPICILLIN >=32 RESISTANT Resistant     CEFAZOLIN <=4 SENSITIVE Sensitive     CEFEPIME <=0.12 SENSITIVE Sensitive     CEFTRIAXONE <=0.25 SENSITIVE Sensitive     CIPROFLOXACIN <=0.25 SENSITIVE Sensitive     GENTAMICIN <=1 SENSITIVE Sensitive     IMIPENEM <=0.25 SENSITIVE Sensitive     NITROFURANTOIN 64 INTERMEDIATE Intermediate     TRIMETH/SULFA <=20 SENSITIVE Sensitive     AMPICILLIN/SULBACTAM 4 SENSITIVE Sensitive     PIP/TAZO <=4 SENSITIVE Sensitive     * >=100,000 COLONIES/mL KLEBSIELLA PNEUMONIAE   Studies/Results: US LIVER DOPPLER  Result Date: 01/18/2021 CLINICAL DATA:  Cirrhosis EXAM: DUPLEX ULTRASOUND OF LIVER  TECHNIQUE: Color and duplex Doppler ultrasound was performed to evaluate the hepatic in-flow and out-flow vessels. COMPARISON:  CT 01/12/2021 FINDINGS: Liver: Coarse echotexture without focal lesion. Nodular hepatic contour. No focal lesion, mass or intrahepatic biliary ductal dilatation. Main Portal Vein size: 1.4 cm Portal Vein Velocities (all hepatopetal): Main Prox:  28 cm/sec Main Mid: 31 cm/sec Main Dist:  22 cm/sec Right: 20 cm/sec Left: 13 cm/sec Hepatic Vein Velocities (all hepatofugal): Right:  14 cm/sec Middle:  43 cm/sec Left:  27 cm/sec IVC: Present and patent with normal respiratory phasicity. Velocity 47 cm/sec Hepatic Artery Velocity:  49 cm/sec Splenic Vein Velocity:  21 cm/sec Spleen: 19 cm x 6 cm x 13 cm with a total volume of 739 cm^3 (411 cm^3 is upper limit normal) Portal Vein Occlusion/Thrombus: No Splenic Vein Occlusion/Thrombus: No Ascites: Present, mild Varices: None Recanalized umbilical vein incidentally noted. Gallbladder wall thickening up to 8 mm. IMPRESSION: 1. Unremarkable hepatic vascular Doppler evaluation. 2. Nodular hepatic contour suggesting cirrhosis, with stigmata of portal venous hypertension including splenomegaly and ascites. 3. Nonspecific gallbladder wall thickening. Electronically  Signed   By: Corlis Leak M.D.   On: 01/18/2021 13:25   Medications: I have reviewed the patient's current medications. Scheduled Meds:  folic acid  1 mg Oral Daily   midodrine  10 mg Oral TID WC   nystatin   Topical TID   spironolactone  100 mg Oral Daily   vitamin B-12  500 mcg Oral Daily   Vitamin D (Ergocalciferol)  50,000 Units Oral Q7 days   Continuous Infusions:  albumin human     albumin human     PRN Meds:.albumin human, dextromethorphan-guaiFENesin, liver oil-zinc oxide, ondansetron **OR** ondansetron (ZOFRAN) IV, senna-docusate, simethicone   Assessment: Principal Problem:   Abdominal ascites Active Problems:   Obesity, Class III, BMI 40-49.9 (morbid obesity)  (HCC)   Pleural effusion   Thrombocytopenia (HCC)   Liver cirrhosis (HCC)   Ascites of liver    Plan: This patient has end-stage liver disease with ascites who had a paracentesis done today.  The patient's creatinine has been slightly over 1 with her potassium being 3.4 today and 3.6 yesterday.  The patient may benefit from a TIPS procedure in the future.  Nothing more to add on this very pleasant patient with a very bad disease.  She should follow-up with Dr. Tobi Bastos as an outpatient.   LOS: 6 days   Sherlyn Hay 01/19/2021, 5:39 PM Pager [phone] 7am-5pm  Check AMION for 5pm -7am coverage and on weekends

## 2021-01-19 NOTE — Progress Notes (Signed)
PROGRESS NOTE    Joan Roman  WUJ:[id number] DOB: 24-Feb-1951 DOA: 01/12/2021 PCP: Pcp, No   Brief Narrative:  70 year old with no known past medical history admitted to the hospital for progressive bilateral lower extremity swelling for about 1 and half months, abdominal swelling found to have new onset of decompensated cirrhosis.  During hospitalization underwent large-volume paracentesis on 8/17, 8/19 complicated by soft blood pressure.  Received IV albumin and midodrine.  GI team was consulted, Lasix drip was discontinued and and started oral Aldactone with plans to slowly start lasix.    Assessment & Plan:   Principal Problem:   Abdominal ascites Active Problems:   Obesity, Class III, BMI 40-49.9 (morbid obesity) (HCC)   Pleural effusion   Thrombocytopenia (HCC)   Liver cirrhosis (HCC)   Ascites of liver  Decompensated liver cirrhosis with ascites Thrombocytopenia, hypoalbuminemia - Unknown exact etiology.  Acute hepatitis panel negative, TSH normal, HIV nonreactive.  Will defer autoimmune work-up to GI.  Echocardiogram shows EF 65 to 70%.  Lipid panel normal, A1c 4.4.  Status post paracentesis 8/17, 8/19 status post albumin.  On midodrine 10 mg 3 times daily. - Currently on Aldactone.  If pressures remain stable eventually add Lasix. - Abdomen is again distended today therefore will order paracentesis, if large-volume patient will get albumin  Diarrhea - Nonspecific.  Unlikely infectious.  Lactulose discontinued.  Clinically monitor.  Currently afebrile, check CBC to make sure she has not developed leukocytosis.  Did receive Rocephin and Keflex for UTI but unlikel to lead to C diff.   Hypokalemia - Repletion ordered  Trace Right sided pleural effusion - Likely from underlying liver cirrhosis  Vitamin D deficiency - Oral supplements  Folate deficiency Borderline vitamin B12 deficiency -Supplements have been started  Urinary tract infection with Klebsiella -  Received 1 dose of IV Rocephin followed by Keflex for 3 days   DVT prophylaxis: Place TED hose Start: 01/12/21 1842 Code Status: Full Family Communication:    Status is: Inpatient  Remains inpatient appropriate because:Inpatient level of care appropriate due to severity of illness  Dispo: The patient is from: Home              Anticipated d/c is to: Home              Patient currently is not medically stable to d/c.  Still undergoing Fluid management.  Will require paracentesis in next 24-48 hours   Difficult to place patient No       Subjective: Tells me her diarrhea is slightly better over last 24 hours.  Her abdomen feels more distended and slightly heavier on the right side.  Review of Systems Otherwise negative except as per HPI, including: General: Denies fever, chills, night sweats or unintended weight loss. Resp: Denies cough, wheezing, shortness of breath. Cardiac: Denies chest pain, palpitations, orthopnea, paroxysmal nocturnal dyspnea. GI: Denies abdominal pain, nausea, vomiting, diarrhea or constipation GU: Denies dysuria, frequency, hesitancy or incontinence MS: Denies muscle aches, joint pain or swelling Neuro: Denies headache, neurologic deficits (focal weakness, numbness, tingling), abnormal gait Psych: Denies anxiety, depression, SI/HI/AVH Skin: Denies new rashes or lesions ID: Denies sick contacts, exotic exposures, travel  Examination: Constitutional: Not in acute distress Respiratory: Clear to auscultation bilaterally Cardiovascular: Normal sinus rhythm, no rubs Abdomen: Abdomen is slightly more distended with fluid wave shift.  Positive bowel sounds. Musculoskeletal: 1+ bilateral lower extremity pitting edema Skin: No rashes seen Neurologic: CN 2-12 grossly intact.  And nonfocal Psychiatric: Normal judgment and insight.  Alert and oriented x 3. Normal mood.  Objective: Vitals:   01/18/21 2031 01/19/21 0101 01/19/21 0501 01/19/21 0755  BP: 100/63  (!) 88/59 (!) 94/59 100/63  Pulse: 68 65 76 69  Resp: 16 18 16 17   Temp: 97.7 F (36.5 C) 98.4 F (36.9 C) 98 F (36.7 C) 98.1 F (36.7 C)  TempSrc: Oral     SpO2: 97% 94% 95% 96%  Weight:      Height:        Intake/Output Summary (Last 24 hours) at 01/19/2021 1135 Last data filed at 01/19/2021 1013 Gross per 24 hour  Intake 600 ml  Output --  Net 600 ml   Filed Weights   01/12/21 1353 01/12/21 2111  Weight: 111.1 kg 111.6 kg     Data Reviewed:   CBC: Recent Labs  Lab 01/13/21 0621 01/14/21 0534 01/15/21 0422 01/16/21 0418 01/17/21 0542  WBC 4.2 5.4 3.9* 3.9* 3.4*  HGB 12.8 13.5 12.8 12.8 12.5  HCT 38.8 41.4 38.5 39.0 38.3  MCV 80.0 78.4* 80.0 78.2* 80.3  PLT 80* 76* 62* 65* 66*   Basic Metabolic Panel: Recent Labs  Lab 01/13/21 1031 01/14/21 0534 01/15/21 0422 01/16/21 0418 01/17/21 0542 01/18/21 0555 01/19/21 0439  NA  --  136 136 136 136 138 137  K  --  3.3* 3.2* 2.9* 3.0* 3.6 3.4*  CL  --  97* 95* 94* 90* 95* 93*  CO2  --  29 31 32 33* 35* 34*  GLUCOSE  --  120* 95 98 91 95 112*  BUN  --  19 18 16 15 16 18   CREATININE  --  1.15* 1.18* 1.08* 0.90 1.02* 1.03*  CALCIUM  --  8.2* 8.3* 8.0* 8.0* 8.5* 8.6*  MG 1.9 1.7 1.7 1.5* 1.6* 1.8 1.9  PHOS 2.7 3.4 3.4 2.7 2.5  --   --    GFR: Estimated Creatinine Clearance: 64 mL/min (A) (by C-G formula based on SCr of 1.03 mg/dL (H)). Liver Function Tests: Recent Labs  Lab 01/14/21 0534 01/15/21 0422 01/16/21 0418 01/18/21 0555 01/19/21 0439  AST 24 19 19 23 26   ALT 9 7 6 8 8   ALKPHOS 39 37* 35* 36* 41  BILITOT 1.6* 1.7* 1.8* 2.0* 1.4*  PROT 5.7* 6.1* 5.2* 5.5* 5.5*  ALBUMIN 2.6* 3.3* 2.8* 3.1* 2.9*   No results for input(s): LIPASE, AMYLASE in the last 168 hours. Recent Labs  Lab 01/13/21 1031  AMMONIA 21   Coagulation Profile: Recent Labs  Lab 01/18/21 0751  INR 1.3*   Cardiac Enzymes: No results for input(s): CKTOTAL, CKMB, CKMBINDEX, TROPONINI in the last 168 hours. BNP (last 3  results) No results for input(s): PROBNP in the last 8760 hours. HbA1C: No results for input(s): HGBA1C in the last 72 hours. CBG: Recent Labs  Lab 01/17/21 0717 01/17/21 1146 01/17/21 2026 01/18/21 0816 01/18/21 1122  GLUCAP 89 103* 101* 86 95   Lipid Profile: Recent Labs    01/17/21 0542  CHOL 54  HDL 13*  LDLCALC 28  TRIG 67  CHOLHDL 4.2   Thyroid Function Tests: No results for input(s): TSH, T4TOTAL, FREET4, T3FREE, THYROIDAB in the last 72 hours. Anemia Panel: No results for input(s): VITAMINB12, FOLATE, FERRITIN, TIBC, IRON, RETICCTPCT in the last 72 hours. Sepsis Labs: No results for input(s): PROCALCITON, LATICACIDVEN in the last 168 hours.  Recent Results (from the past 240 hour(s))  Resp Panel by RT-PCR (Flu A&B, Covid) Nasopharyngeal Swab     Status: None  Collection Time: 01/12/21  5:20 PM   Specimen: Nasopharyngeal Swab; Nasopharyngeal(NP) swabs in vial transport medium  Result Value Ref Range Status   SARS Coronavirus 2 by RT PCR NEGATIVE NEGATIVE Final    Comment: (NOTE) SARS-CoV-2 target nucleic acids are NOT DETECTED.  The SARS-CoV-2 RNA is generally detectable in upper respiratory specimens during the acute phase of infection. The lowest concentration of SARS-CoV-2 viral copies this assay can detect is 138 copies/mL. A negative result does not preclude SARS-Cov-2 infection and should not be used as the sole basis for treatment or other patient management decisions. A negative result may occur with  improper specimen collection/handling, submission of specimen other than nasopharyngeal swab, presence of viral mutation(s) within the areas targeted by this assay, and inadequate number of viral copies(<138 copies/mL). A negative result must be combined with clinical observations, patient history, and epidemiological information. The expected result is Negative.  Fact Sheet for Patients:  [employer]  Fact Sheet for  Healthcare Providers:  [employer]ker[employer]  This test is no t yet approved or cleared by the [place] FDA and  has been authorized for detection and/or diagnosis of SARS-CoV-2 by FDA under an Emergency Use Authorization (EUA). This EUA will remain  in effect (meaning this test can be used) for the duration of the COVID-19 declaration under Section 564(b)(1) of the Act, 21 U.S.C.section 360bbb-3(b)(1), unless the authorization is terminated  or revoked sooner.       Influenza A by PCR NEGATIVE NEGATIVE Final   Influenza B by PCR NEGATIVE NEGATIVE Final    Comment: (NOTE) The Xpert Xpress SARS-CoV-2/FLU/RSV plus assay is intended as an aid in the diagnosis of influenza from Nasopharyngeal swab specimens and should not be used as a sole basis for treatment. Nasal washings and aspirates are unacceptable for Xpert Xpress SARS-CoV-2/FLU/RSV testing.  Fact Sheet for Patients: [employer]Course.[employer]  Fact Sheet for Healthcare Providers: [employer]ker[employer]  This test is not yet approved or cleared by the [place] FDA and has been authorized for detection and/or diagnosis of SARS-CoV-2 by FDA under an Emergency Use Authorization (EUA). This EUA will remain in effect (meaning this test can be used) for the duration of the COVID-19 declaration under Section 564(b)(1) of the Act, 21 U.S.C. section 360bbb-3(b)(1), unless the authorization is terminated or revoked.  Performed at [facility], [address], [place], [place] [address]   Peritoneal fluid culture w Gram Stain     Status: None   Collection Time: 01/12/21  7:10 PM   Specimen: Peritoneal Washings; Peritoneal Fluid  Result Value Ref Range Status   Specimen Description   Final    PERITONEAL FLUID Performed at [facility], [address] [place], [place] [address]    Special Requests   Final    NONE Performed at [facility], [address] [place], [place] [address]    Gram Stain   Final    FEW SQUAMOUS EPITHELIAL CELLS PRESENT MODERATE WBC SEEN NO ORGANISMS SEEN    Culture   Final    NO GROWTH 3 DAYS Performed at [facility], [address], [place], [place] [address]    Report Status 01/16/2021 FINAL  Final  Urine Culture     Status: Abnormal   Collection Time: 01/13/21  8:28 AM   Specimen: Urine, Clean Catch  Result Value Ref Range Status   Specimen Description   Final    URINE, CLEAN CATCH Performed at [facility], [address]  [place], [place] [address]    Special Requests   Final    NONE Performed at [facility], [address] [place], [place] [address]    Culture >=100,000 COLONIES/mL KLEBSIELLA PNEUMONIAE (A)  Final   Report Status 01/15/2021 FINAL  Final   Organism ID, Bacteria KLEBSIELLA PNEUMONIAE (A)  Final      Susceptibility   Klebsiella pneumoniae - MIC*    AMPICILLIN >=32 RESISTANT Resistant     CEFAZOLIN <=4 SENSITIVE Sensitive     CEFEPIME <=0.12 SENSITIVE Sensitive     CEFTRIAXONE <=0.25 SENSITIVE Sensitive     CIPROFLOXACIN <=0.25 SENSITIVE Sensitive     GENTAMICIN <=1 SENSITIVE Sensitive     IMIPENEM <=0.25 SENSITIVE Sensitive     NITROFURANTOIN 64 INTERMEDIATE Intermediate     TRIMETH/SULFA <=20 SENSITIVE Sensitive     AMPICILLIN/SULBACTAM 4 SENSITIVE Sensitive     PIP/TAZO <=4 SENSITIVE Sensitive     * >=100,000 COLONIES/mL KLEBSIELLA PNEUMONIAE         Radiology Studies: US LIVER DOPPLER  Result Date: 01/18/2021 CLINICAL DATA:  Cirrhosis EXAM: DUPLEX ULTRASOUND OF LIVER TECHNIQUE: Color and duplex Doppler ultrasound was performed to evaluate the hepatic in-flow and out-flow vessels. COMPARISON:  CT 01/12/2021 FINDINGS: Liver: Coarse echotexture without focal lesion. Nodular hepatic contour. No focal lesion, mass or intrahepatic biliary ductal dilatation. Main Portal Vein size: 1.4 cm Portal Vein Velocities (all  hepatopetal): Main Prox:  28 cm/sec Main Mid: 31 cm/sec Main Dist:  22 cm/sec Right: 20 cm/sec Left: 13 cm/sec Hepatic Vein Velocities (all hepatofugal): Right:  14 cm/sec Middle:  43 cm/sec Left:  27 cm/sec IVC: Present and patent with normal respiratory phasicity. Velocity 47 cm/sec Hepatic Artery Velocity:  49 cm/sec Splenic Vein Velocity:  21 cm/sec Spleen: 19 cm x 6 cm x 13 cm with a total volume of 739 cm^3 (411 cm^3 is upper limit normal) Portal Vein Occlusion/Thrombus: No Splenic Vein Occlusion/Thrombus: No Ascites: Present, mild Varices: None Recanalized umbilical vein incidentally noted. Gallbladder wall thickening up to 8 mm. IMPRESSION: 1. Unremarkable hepatic vascular Doppler evaluation. 2. Nodular hepatic contour suggesting cirrhosis, with stigmata of portal venous hypertension including splenomegaly and ascites. 3. Nonspecific gallbladder wall thickening. Electronically Signed   By: Corlis Leak M.D.   On: 01/18/2021 13:25        Scheduled Meds:  folic acid  1 mg Oral Daily   midodrine  10 mg Oral TID WC   spironolactone  100 mg Oral Daily   vitamin B-12  500 mcg Oral Daily   Vitamin D (Ergocalciferol)  50,000 Units Oral Q7 days   Continuous Infusions:  albumin human     albumin human       LOS: 6 days   Time spent= 35 mins    Shawnia Vizcarrondo Joline Maxcy, MD [facility]  If 7PM-7AM, please contact night-coverage  01/19/2021, 11:35 AM

## 2021-01-19 NOTE — Procedures (Signed)
PROCEDURE SUMMARY:  Successful US guided paracentesis from RLQ.  Yielded 5000 mL of clear yellow fluid.  No immediate complications.  Pt tolerated well.   Specimen was sent for labs.  EBL < 33mL  Cloretta Ned 01/19/2021 4:46 PM

## 2021-01-19 NOTE — Progress Notes (Signed)
Educated and encouraged pt on the importance of foley catheter and rectal tube in prevention of further skin breakdown. Pt refusing rectal tube because earlier attempt was "too painful." Charge nurse at bedside during insertion of foley catheter. Pt tolerated well.

## 2021-01-20 DIAGNOSIS — R188 Other ascites: Secondary | ICD-10-CM | POA: Diagnosis not present

## 2021-01-20 DIAGNOSIS — K7460 Unspecified cirrhosis of liver: Secondary | ICD-10-CM | POA: Diagnosis not present

## 2021-01-20 LAB — PATHOLOGIST SMEAR REVIEW

## 2021-01-20 LAB — CBC
HCT: 36.2 % (ref 36.0–46.0)
Hemoglobin: 11.7 g/dL — ABNORMAL LOW (ref 12.0–15.0)
MCH: 26.1 pg (ref 26.0–34.0)
MCHC: 32.3 g/dL (ref 30.0–36.0)
MCV: 80.6 fL (ref 80.0–100.0)
Platelets: 62 10*3/uL — ABNORMAL LOW (ref 150–400)
RBC: 4.49 MIL/uL (ref 3.87–5.11)
RDW: 16.9 % — ABNORMAL HIGH (ref 11.5–15.5)
WBC: 3.1 10*3/uL — ABNORMAL LOW (ref 4.0–10.5)
nRBC: 0 % (ref 0.0–0.2)

## 2021-01-20 LAB — COMPREHENSIVE METABOLIC PANEL
ALT: 10 U/L (ref 0–44)
AST: 25 U/L (ref 15–41)
Albumin: 3.5 g/dL (ref 3.5–5.0)
Alkaline Phosphatase: 32 U/L — ABNORMAL LOW (ref 38–126)
Anion gap: 9 (ref 5–15)
BUN: 18 mg/dL (ref 8–23)
CO2: 33 mmol/L — ABNORMAL HIGH (ref 22–32)
Calcium: 8.9 mg/dL (ref 8.9–10.3)
Chloride: 92 mmol/L — ABNORMAL LOW (ref 98–111)
Creatinine, Ser: 1.1 mg/dL — ABNORMAL HIGH (ref 0.44–1.00)
GFR, Estimated: 55 mL/min — ABNORMAL LOW (ref >60)
Glucose, Bld: 114 mg/dL — ABNORMAL HIGH (ref 70–99)
Potassium: 3.5 mmol/L (ref 3.5–5.1)
Sodium: 134 mmol/L — ABNORMAL LOW (ref 135–145)
Total Bilirubin: 1.2 mg/dL (ref 0.3–1.2)
Total Protein: 5.8 g/dL — ABNORMAL LOW (ref 6.5–8.1)

## 2021-01-20 LAB — MAGNESIUM: Magnesium: 1.9 mg/dL (ref 1.7–2.4)

## 2021-01-20 NOTE — Progress Notes (Addendum)
Bed pad wet underneath patient, there is urine in the foley bag. Foley balloon deflated withdrew 9cc, catheter advanced and re-inflated with 10cc normal saline. Urine returned, foley secured.

## 2021-01-20 NOTE — Progress Notes (Signed)
PROGRESS NOTE    Joan Roman  KXF:[id number] DOB: Nov 26, 1950 DOA: 01/12/2021 PCP: Pcp, No   Brief Narrative:  70 year old with no known past medical history admitted to the hospital for progressive bilateral lower extremity swelling for about 1 and half months, abdominal swelling found to have new onset of decompensated cirrhosis.  During hospitalization underwent large-volume paracentesis on 8/17, 8/19 complicated by soft blood pressure.  Received IV albumin and midodrine.  GI team was consulted, Lasix drip was discontinued and and started oral Aldactone with plans to slowly start lasix.    Assessment & Plan:   Principal Problem:   Abdominal ascites Active Problems:   Obesity, Class III, BMI 40-49.9 (morbid obesity) (HCC)   Pleural effusion   Thrombocytopenia (HCC)   Liver cirrhosis (HCC)   Ascites of liver  Decompensated liver cirrhosis with ascites Thrombocytopenia, hypoalbuminemia - Unknown etiology - acute hepatitis panel negative, TSH normal, HIV nonreactive.   - Autoimmune work-up ongoing with GI.  Echocardiogram shows EF 65 to 70%.  Lipid panel normal, A1c 4.4.  Status post paracentesis 8/17, 8/19, 8/23 status post albumin.  On midodrine 10 mg 3 times daily. - Currently on Aldactone.  If pressures remain stable over the next 24 hours we will add low-dose Lasix and titrate to tolerance  Diarrhea -Improving, nonspecific, unlikely infectious, patient transiently on antibiotics for UTI as below but labs not consistent with C. Difficile -possibly viral  Hypokalemia -Continue to follow  Trace Right sided pleural effusion - Likely from underlying liver cirrhosis  Vitamin D deficiency - Oral supplements  Folate deficiency Borderline vitamin B12 deficiency -Supplements have been started  Urinary tract infection with Klebsiella - Received 1 dose of IV Rocephin followed by Keflex for 3 days   DVT prophylaxis: Place TED hose Start: 01/12/21 1842 Code Status:  Full Family Communication:    Status is: Inpatient  Remains inpatient appropriate because:Inpatient level of care appropriate due to severity of illness  Dispo: The patient is from: Home              Anticipated d/c is to: Home              Patient currently is not medically stable to d/c.  Still undergoing Fluid management.  Will require paracentesis in next 24-48 hours   Difficult to place patient No       Subjective: No acute issues or events overnight, diarrhea improving but not yet resolved otherwise denies nausea vomiting headache fevers chills chest pain shortness of breath  Examination: Constitutional: Not in acute distress Respiratory: Clear to auscultation bilaterally Cardiovascular: Normal sinus rhythm, no rubs Abdomen: Soft nontender with minimal fluid wave Musculoskeletal: 1+ bilateral lower extremity pitting edema Skin: No rashes seen Neurologic: CN 2-12 grossly intact.  And nonfocal Psychiatric: Normal judgment and insight. Alert and oriented x 3. Normal mood.  Objective: Vitals:   01/19/21 1640 01/19/21 1958 01/20/21 0008 01/20/21 0421  BP: 97/63 (!) 100/59 (!) 98/58 96/64  Pulse: 66 65 64 65  Resp:  16 16 16   Temp:  98.3 F (36.8 C) 97.6 F (36.4 C) 97.8 F (36.6 C)  TempSrc:  Oral Oral   SpO2: 99% 98% 94% 94%  Weight:      Height:        Intake/Output Summary (Last 24 hours) at 01/20/2021 0742 Last data filed at 01/20/2021 0600 Gross per 24 hour  Intake 917 ml  Output 200 ml  Net 717 ml    Filed Weights   01/12/21  1353 01/12/21 2111  Weight: 111.1 kg 111.6 kg     Data Reviewed:   CBC: Recent Labs  Lab 01/14/21 0534 01/15/21 0422 01/16/21 0418 01/17/21 0542 01/20/21 0527  WBC 5.4 3.9* 3.9* 3.4* 3.1*  HGB 13.5 12.8 12.8 12.5 11.7*  HCT 41.4 38.5 39.0 38.3 36.2  MCV 78.4* 80.0 78.2* 80.3 80.6  PLT 76* 62* 65* 66* 62*    Basic Metabolic Panel: Recent Labs  Lab 01/13/21 1031 01/13/21 1031 01/14/21 0534 01/15/21 0422  01/16/21 0418 01/17/21 0542 01/18/21 0555 01/19/21 0439 01/20/21 0527  NA  --    < > 136 136 136 136 138 137 134*  K  --    < > 3.3* 3.2* 2.9* 3.0* 3.6 3.4* 3.5  CL  --    < > 97* 95* 94* 90* 95* 93* 92*  CO2  --    < > 29 31 32 33* 35* 34* 33*  GLUCOSE  --    < > 120* 95 98 91 95 112* 114*  BUN  --    < > 19 18 16 15 16 18 18   CREATININE  --    < > 1.15* 1.18* 1.08* 0.90 1.02* 1.03* 1.10*  CALCIUM  --    < > 8.2* 8.3* 8.0* 8.0* 8.5* 8.6* 8.9  MG 1.9  --  1.7 1.7 1.5* 1.6* 1.8 1.9 1.9  PHOS 2.7  --  3.4 3.4 2.7 2.5  --   --   --    < > = values in this interval not displayed.    GFR: Estimated Creatinine Clearance: 59.9 mL/min (A) (by C-G formula based on SCr of 1.1 mg/dL (H)). Liver Function Tests: Recent Labs  Lab 01/15/21 0422 01/16/21 0418 01/18/21 0555 01/19/21 0439 01/20/21 0527  AST 19 19 23 26 25   ALT 7 6 8 8 10   ALKPHOS 37* 35* 36* 41 32*  BILITOT 1.7* 1.8* 2.0* 1.4* 1.2  PROT 6.1* 5.2* 5.5* 5.5* 5.8*  ALBUMIN 3.3* 2.8* 3.1* 2.9* 3.5    No results for input(s): LIPASE, AMYLASE in the last 168 hours. Recent Labs  Lab 01/13/21 1031  AMMONIA 21    Coagulation Profile: Recent Labs  Lab 01/18/21 0751  INR 1.3*    Cardiac Enzymes: No results for input(s): CKTOTAL, CKMB, CKMBINDEX, TROPONINI in the last 168 hours. BNP (last 3 results) No results for input(s): PROBNP in the last 8760 hours. HbA1C: No results for input(s): HGBA1C in the last 72 hours. CBG: Recent Labs  Lab 01/17/21 0717 01/17/21 1146 01/17/21 2026 01/18/21 0816 01/18/21 1122  GLUCAP 89 103* 101* 86 95    Lipid Profile: No results for input(s): CHOL, HDL, LDLCALC, TRIG, CHOLHDL, LDLDIRECT in the last 72 hours.  Thyroid Function Tests: No results for input(s): TSH, T4TOTAL, FREET4, T3FREE, THYROIDAB in the last 72 hours. Anemia Panel: No results for input(s): VITAMINB12, FOLATE, FERRITIN, TIBC, IRON, RETICCTPCT in the last 72 hours. Sepsis Labs: No results for input(s):  PROCALCITON, LATICACIDVEN in the last 168 hours.  Recent Results (from the past 240 hour(s))  Resp Panel by RT-PCR (Flu A&B, Covid) Nasopharyngeal Swab     Status: None   Collection Time: 01/12/21  5:20 PM   Specimen: Nasopharyngeal Swab; Nasopharyngeal(NP) swabs in vial transport medium  Result Value Ref Range Status   SARS Coronavirus 2 by RT PCR NEGATIVE NEGATIVE Final    Comment: (NOTE) SARS-CoV-2 target nucleic acids are NOT DETECTED.  The SARS-CoV-2 RNA is generally detectable in upper respiratory specimens  during the acute phase of infection. The lowest concentration of SARS-CoV-2 viral copies this assay can detect is 138 copies/mL. A negative result does not preclude SARS-Cov-2 infection and should not be used as the sole basis for treatment or other patient management decisions. A negative result may occur with  improper specimen collection/handling, submission of specimen other than nasopharyngeal swab, presence of viral mutation(s) within the areas targeted by this assay, and inadequate number of viral copies(<138 copies/mL). A negative result must be combined with clinical observations, patient history, and epidemiological information. The expected result is Negative.  Fact Sheet for Patients:  BloggerCourse.com  Fact Sheet for Healthcare Providers:  SeriousBroker.it  This test is no t yet approved or cleared by the [place] FDA and  has been authorized for detection and/or diagnosis of SARS-CoV-2 by FDA under an Emergency Use Authorization (EUA). This EUA will remain  in effect (meaning this test can be used) for the duration of the COVID-19 declaration under Section 564(b)(1) of the Act, 21 U.S.C.section 360bbb-3(b)(1), unless the authorization is terminated  or revoked sooner.       Influenza A by PCR NEGATIVE NEGATIVE Final   Influenza B by PCR NEGATIVE NEGATIVE Final    Comment: (NOTE) The Xpert Xpress  SARS-CoV-2/FLU/RSV plus assay is intended as an aid in the diagnosis of influenza from Nasopharyngeal swab specimens and should not be used as a sole basis for treatment. Nasal washings and aspirates are unacceptable for Xpert Xpress SARS-CoV-2/FLU/RSV testing.  Fact Sheet for Patients: BloggerCourse.com  Fact Sheet for Healthcare Providers: SeriousBroker.it  This test is not yet approved or cleared by the [place] FDA and has been authorized for detection and/or diagnosis of SARS-CoV-2 by FDA under an Emergency Use Authorization (EUA). This EUA will remain in effect (meaning this test can be used) for the duration of the COVID-19 declaration under Section 564(b)(1) of the Act, 21 U.S.C. section 360bbb-3(b)(1), unless the authorization is terminated or revoked.  Performed at [facility], [address], [place], [place] [address]   Peritoneal fluid culture w Gram Stain     Status: None   Collection Time: 01/12/21  7:10 PM   Specimen: Peritoneal Washings; Peritoneal Fluid  Result Value Ref Range Status   Specimen Description   Final    PERITONEAL FLUID Performed at [facility], [address], [place], [place] [address]    Special Requests   Final    NONE Performed at [facility], [address] [place], [place] [address]    Gram Stain   Final    FEW SQUAMOUS EPITHELIAL CELLS PRESENT MODERATE WBC SEEN NO ORGANISMS SEEN    Culture   Final    NO GROWTH 3 DAYS Performed at [facility], [address], [place], [place] [address]    Report Status 01/16/2021 FINAL  Final  Urine Culture     Status: Abnormal   Collection Time: 01/13/21  8:28 AM   Specimen: Urine, Clean Catch  Result Value Ref Range Status   Specimen Description   Final    URINE, CLEAN CATCH Performed at [facility], [address] [place], [place] [address]    Special Requests   Final    NONE Performed  at [facility], [address] [place], [place] [address]    Culture >=100,000 COLONIES/mL KLEBSIELLA PNEUMONIAE (A)  Final   Report Status 01/15/2021 FINAL  Final   Organism ID, Bacteria KLEBSIELLA PNEUMONIAE (A)  Final  Susceptibility   Klebsiella pneumoniae - MIC*    AMPICILLIN >=32 RESISTANT Resistant     CEFAZOLIN <=4 SENSITIVE Sensitive     CEFEPIME <=0.12 SENSITIVE Sensitive     CEFTRIAXONE <=0.25 SENSITIVE Sensitive     CIPROFLOXACIN <=0.25 SENSITIVE Sensitive     GENTAMICIN <=1 SENSITIVE Sensitive     IMIPENEM <=0.25 SENSITIVE Sensitive     NITROFURANTOIN 64 INTERMEDIATE Intermediate     TRIMETH/SULFA <=20 SENSITIVE Sensitive     AMPICILLIN/SULBACTAM 4 SENSITIVE Sensitive     PIP/TAZO <=4 SENSITIVE Sensitive     * >=100,000 COLONIES/mL KLEBSIELLA PNEUMONIAE          Radiology Studies: US LIVER DOPPLER  Result Date: 01/18/2021 CLINICAL DATA:  Cirrhosis EXAM: DUPLEX ULTRASOUND OF LIVER TECHNIQUE: Color and duplex Doppler ultrasound was performed to evaluate the hepatic in-flow and out-flow vessels. COMPARISON:  CT 01/12/2021 FINDINGS: Liver: Coarse echotexture without focal lesion. Nodular hepatic contour. No focal lesion, mass or intrahepatic biliary ductal dilatation. Main Portal Vein size: 1.4 cm Portal Vein Velocities (all hepatopetal): Main Prox:  28 cm/sec Main Mid: 31 cm/sec Main Dist:  22 cm/sec Right: 20 cm/sec Left: 13 cm/sec Hepatic Vein Velocities (all hepatofugal): Right:  14 cm/sec Middle:  43 cm/sec Left:  27 cm/sec IVC: Present and patent with normal respiratory phasicity. Velocity 47 cm/sec Hepatic Artery Velocity:  49 cm/sec Splenic Vein Velocity:  21 cm/sec Spleen: 19 cm x 6 cm x 13 cm with a total volume of 739 cm^3 (411 cm^3 is upper limit normal) Portal Vein Occlusion/Thrombus: No Splenic Vein Occlusion/Thrombus: No Ascites: Present, mild Varices: None Recanalized umbilical vein incidentally noted. Gallbladder wall thickening up to 8 mm.  IMPRESSION: 1. Unremarkable hepatic vascular Doppler evaluation. 2. Nodular hepatic contour suggesting cirrhosis, with stigmata of portal venous hypertension including splenomegaly and ascites. 3. Nonspecific gallbladder wall thickening. Electronically Signed   By: Corlis Leak  Hassell M.D.   On: 01/18/2021 13:25     Scheduled Meds:  Chlorhexidine Gluconate Cloth  6 each Topical Daily   folic acid  1 mg Oral Daily   midodrine  10 mg Oral TID WC   nystatin   Topical TID   spironolactone  100 mg Oral Daily   vitamin B-12  500 mcg Oral Daily   Vitamin D (Ergocalciferol)  50,000 Units Oral Q7 days   Continuous Infusions:  albumin human       LOS: 7 days   Time spent= 35 mins  Azucena FallenWilliam C Dantre Yearwood, DO [facility]  If 7PM-7AM, please contact night-coverage  01/20/2021, 7:42 AM

## 2021-01-20 NOTE — Progress Notes (Signed)
[name] , MD [address], [place], [place], [address] [address], [place], [place], [address] Phone: [phone]  Fax: [phone]   Joan Roman is being followed for ascites  Day 3 of follow up   Subjective:   Feels better after paracentesis no other complaints adhering to a low-salt diet   Objective: Vital signs in last 24 hours: Vitals:   01/19/21 1640 01/19/21 1958 01/20/21 0008 01/20/21 0421  BP: 97/63 (!) 100/59 (!) 98/58 96/64  Pulse: 66 65 64 65  Resp:  16 16 16   Temp:  98.3 F (36.8 C) 97.6 F (36.4 C) 97.8 F (36.6 C)  TempSrc:  Oral Oral   SpO2: 99% 98% 94% 94%  Weight:      Height:       Weight change:   Intake/Output Summary (Last 24 hours) at 01/20/2021 0748 Last data filed at 01/20/2021 0600 Gross per 24 hour  Intake 917 ml  Output 200 ml  Net 717 ml     Exam:  Abdomen: soft, nontender, normal bowel sounds.  Does have distention in her flanks no free fluid on percussion   Lab Results: @LABTEST2 @ Micro Results: Recent Results (from the past 240 hour(s))  Resp Panel by RT-PCR (Flu A&B, Covid) Nasopharyngeal Swab     Status: None   Collection Time: 01/12/21  5:20 PM   Specimen: Nasopharyngeal Swab; Nasopharyngeal(NP) swabs in vial transport medium  Result Value Ref Range Status   SARS Coronavirus 2 by RT PCR NEGATIVE NEGATIVE Final    Comment: (NOTE) SARS-CoV-2 target nucleic acids are NOT DETECTED.  The SARS-CoV-2 RNA is generally detectable in upper respiratory specimens during the acute phase of infection. The lowest concentration of SARS-CoV-2 viral copies this assay can detect is 138 copies/mL. A negative result does not preclude SARS-Cov-2 infection and should not be used as the sole basis for treatment or other patient management decisions. A negative result may occur with  improper specimen collection/handling, submission of specimen other than nasopharyngeal swab, presence of viral mutation(s)  within the areas targeted by this assay, and inadequate number of viral copies(<138 copies/mL). A negative result must be combined with clinical observations, patient history, and epidemiological information. The expected result is Negative.  Fact Sheet for Patients:   Fact Sheet for Healthcare Providers:  01/14/21  This test is no t yet approved or cleared by the [employer] FDA and  has been authorized for detection and/or diagnosis of SARS-CoV-2 by FDA under an Emergency Use Authorization (EUA). This EUA will remain  in effect (meaning this test can be used) for the duration of the COVID-19 declaration under Section 564(b)(1) of the Act, 21 U.S.C.section 360bbb-3(b)(1), unless the authorization is terminated  or revoked sooner.       Influenza A by PCR NEGATIVE NEGATIVE Final   Influenza B by PCR NEGATIVE NEGATIVE Final    Comment: (NOTE) The Xpert Xpress SARS-CoV-2/FLU/RSV plus assay is intended as an aid in the diagnosis of influenza from Nasopharyngeal swab specimens and should not be used as a sole basis for treatment. Nasal washings and aspirates are unacceptable for Xpert Xpress SARS-CoV-2/FLU/RSV testing.  Fact Sheet for Patients: SeriousBroker.it  Fact Sheet for Healthcare Providers: [place]  This test is not yet approved or cleared by the [employer]Course.[employer] FDA and has been authorized for detection and/or diagnosis of SARS-CoV-2 by FDA under an Emergency Use Authorization (EUA). This EUA will remain in effect (meaning this test can be used)  for the duration of the COVID-19 declaration under Section 564(b)(1) of the Act, 21 U.S.C. section 360bbb-3(b)(1), unless the authorization is terminated or revoked.  Performed at [facility], [address] [place], [place] [address]   Peritoneal fluid culture w Gram Stain      Status: None   Collection Time: 01/12/21  7:10 PM   Specimen: Peritoneal Washings; Peritoneal Fluid  Result Value Ref Range Status   Specimen Description   Final    PERITONEAL FLUID Performed at [facility], [address] [place], [place] [address]    Special Requests   Final    NONE Performed at [facility], [address] [place], [place] [address]    Gram Stain   Final    FEW SQUAMOUS EPITHELIAL CELLS PRESENT MODERATE WBC SEEN NO ORGANISMS SEEN    Culture   Final    NO GROWTH 3 DAYS Performed at [facility], [address], [place], [place] [address]    Report Status 01/16/2021 FINAL  Final  Urine Culture     Status: Abnormal   Collection Time: 01/13/21  8:28 AM   Specimen: Urine, Clean Catch  Result Value Ref Range Status   Specimen Description   Final    URINE, CLEAN CATCH Performed at [facility], [address] [place], [place] [address]    Special Requests   Final    NONE Performed at [facility], [address] [place], [place] [address]    Culture >=100,000 COLONIES/mL KLEBSIELLA PNEUMONIAE (A)  Final   Report Status 01/15/2021 FINAL  Final   Organism ID, Bacteria KLEBSIELLA PNEUMONIAE (A)  Final      Susceptibility   Klebsiella pneumoniae - MIC*    AMPICILLIN >=32 RESISTANT Resistant     CEFAZOLIN <=4 SENSITIVE Sensitive     CEFEPIME <=0.12 SENSITIVE Sensitive     CEFTRIAXONE <=0.25 SENSITIVE Sensitive     CIPROFLOXACIN <=0.25 SENSITIVE Sensitive     GENTAMICIN <=1 SENSITIVE Sensitive     IMIPENEM <=0.25 SENSITIVE Sensitive     NITROFURANTOIN 64 INTERMEDIATE Intermediate     TRIMETH/SULFA <=20 SENSITIVE Sensitive     AMPICILLIN/SULBACTAM 4 SENSITIVE Sensitive     PIP/TAZO <=4 SENSITIVE Sensitive     * >=100,000 COLONIES/mL KLEBSIELLA PNEUMONIAE   Studies/Results: US LIVER DOPPLER  Result Date: 01/18/2021 CLINICAL DATA:  Cirrhosis EXAM: DUPLEX ULTRASOUND OF LIVER TECHNIQUE: Color and duplex Doppler  ultrasound was performed to evaluate the hepatic in-flow and out-flow vessels. COMPARISON:  CT 01/12/2021 FINDINGS: Liver: Coarse echotexture without focal lesion. Nodular hepatic contour. No focal lesion, mass or intrahepatic biliary ductal dilatation. Main Portal Vein size: 1.4 cm Portal Vein Velocities (all hepatopetal): Main Prox:  28 cm/sec Main Mid: 31 cm/sec Main Dist:  22 cm/sec Right: 20 cm/sec Left: 13 cm/sec Hepatic Vein Velocities (all hepatofugal): Right:  14 cm/sec Middle:  43 cm/sec Left:  27 cm/sec IVC: Present and patent with normal respiratory phasicity. Velocity 47 cm/sec Hepatic Artery Velocity:  49 cm/sec Splenic Vein Velocity:  21 cm/sec Spleen: 19 cm x 6 cm x 13 cm with a total volume of 739 cm^3 (411 cm^3 is upper limit normal) Portal Vein Occlusion/Thrombus: No Splenic Vein Occlusion/Thrombus: No Ascites: Present, mild Varices: None Recanalized umbilical vein incidentally noted. Gallbladder wall thickening up to 8 mm. IMPRESSION: 1. Unremarkable hepatic vascular Doppler evaluation. 2. Nodular hepatic contour suggesting cirrhosis, with stigmata of portal venous hypertension including splenomegaly and ascites. 3. Nonspecific gallbladder wall thickening.  Electronically Signed   By: Corlis Leak M.D.   On: 01/18/2021 13:25   Medications: I have reviewed the patient's current medications. Scheduled Meds:  Chlorhexidine Gluconate Cloth  6 each Topical Daily   folic acid  1 mg Oral Daily   midodrine  10 mg Oral TID WC   nystatin   Topical TID   spironolactone  100 mg Oral Daily   vitamin B-12  500 mcg Oral Daily   Vitamin D (Ergocalciferol)  50,000 Units Oral Q7 days   Continuous Infusions:  albumin human     PRN Meds:.albumin human, dextromethorphan-guaiFENesin, liver oil-zinc oxide, ondansetron **OR** ondansetron (ZOFRAN) IV, senna-docusate, simethicone   Assessment: Principal Problem:   Abdominal ascites Active Problems:   Obesity, Class III, BMI 40-49.9 (morbid obesity)  (HCC)   Pleural effusion   Thrombocytopenia (HCC)   Liver cirrhosis (HCC)   Ascites of liver  Joan Roman 70 y.o. female presents to the ER with abdominal pain and distension . New diagnosis of ascites and liver cirrhosis seen on CT abdomen. No evidence of hepatic encephelopathy. Likely etiology could be NASH.  No evidence of portal vein thrombosis on ultrasound  Plan: Ensure diet is low-sodium and patient is compliant and not snacking on anything else.  No NSAIDs. Outpatient autoimmune work-up Monitor creatinine if it continues to rise we may not be able to further increase diuretics and the patient may require a TIPS.  I have seen that her creatinine has been around 1.1 in the past as well.  Probably where she lives at baseline. Microcytosis check iron studies Till we get her diuretic dose stabilized will need as needed paracentesis and daily weights with BMP   LOS: 7 days   [name], MD 01/20/2021, 7:48 AM

## 2021-01-20 NOTE — Evaluation (Signed)
Physical Therapy Evaluation Patient Details Name: Joan Roman MRN: [id number] DOB: 06-01-1950 Today's Date: 01/20/2021   History of Present Illness  70 year old with no known past medical history admitted to the hospital for progressive bilateral lower extremity swelling for about 1 and half months, abdominal swelling found to have new onset of decompensated cirrhosis.  During hospitalization underwent large-volume paracentesis on 8/17, 8/19 complicated by soft blood pressure and copious loose stools.  Clinical Impression  Pt with some general hesitancy (due to weakness, yesterday's frequency of bowel movements, general anxiousness) but ultimately was able to ambulate into the hallway and show good relative confidence.  She does not typically use/need AD but was clearly reliant on it this date.  She improved cadence with increased time/distance and vitals stayed stable t/o the session.  Overall pt remains far from her baseline but did show ability to manage in the home reasonably well and safety if she gets a walker and BSC; she will also require HHPT to work on getting function back to baseline.     Follow Up Recommendations Home health PT;Supervision - Intermittent    Equipment Recommendations  Rolling walker with 5" wheels;3in1 (PT)    Recommendations for Other Services       Precautions / Restrictions Precautions Precautions: Fall Restrictions Weight Bearing Restrictions: No      Mobility  Bed Mobility Overal bed mobility: Needs Assistance Bed Mobility: Supine to Sit     Supine to sit: Min guard     General bed mobility comments: increased time/effort to perform with use of bed rails to assist but able to attain sitting w/o direct assist from PT.    Transfers Overall transfer level: Needs assistance Equipment used: Rolling walker (2 wheeled) Transfers: Sit to/from Stand Sit to Stand: Min assist;Min guard         General transfer comment: Pt struggled to push  herself up from bed despite cues for appropriate hand placement/sequencing (admittedly her distended abdomen did limit how much she could shift forward.  Very light actual assist with getting to standing  Ambulation/Gait Ambulation/Gait assistance: Min guard Gait Distance (Feet): 65 Feet Assistive device: Rolling walker (2 wheeled)       General Gait Details: Pt did well with ambulation initially with some hesitancy, but ultimately able to show good safety and confidence.  She was reliant on the walker, does not typically need one.  Stairs            Wheelchair Mobility    Modified Rankin (Stroke Patients Only)       Balance Overall balance assessment: Needs assistance Sitting-balance support: No upper extremity supported Sitting balance-Leahy Scale: Good     Standing balance support: Bilateral upper extremity supported Standing balance-Leahy Scale: Fair Standing balance comment: pt typically does not need AD/UEs, clear need of them today.  No overt LOBs but guarded/hesitant this did improve with increased upright time                             Pertinent Vitals/Pain Pain Assessment: 0-10 Pain Score: 3  Pain Location: abdomen Pain Descriptors / Indicators: Aching    Home Living Family/patient expects to be discharged to:: Private residence Living Arrangements: Alone Available Help at Discharge: Family;Available PRN/intermittently (brother works but can check in PRN and help with errands) Type of Home: House Home Access: Stairs to enter   [employer] of Steps: 1+1 Home Layout: Two level;Able to live on main level  with bedroom/bathroom Home Equipment: None      Prior Function Level of Independence: Independent         Comments: Pt independent in all aspects until she reports having increased BLE swelling, making it increasingly difficult to walk, stand, and perform car transfers, leading to this admission.     Hand Dominance    Dominant Hand: Right    Extremity/Trunk Assessment   Upper Extremity Assessment Upper Extremity Assessment: Generalized weakness    Lower Extremity Assessment Lower Extremity Assessment: Generalized weakness       Communication   Communication: No difficulties  Cognition Arousal/Alertness: Awake/alert Behavior During Therapy: WFL for tasks assessed/performed Overall Cognitive Status: Within Functional Limits for tasks assessed                                        General Comments      Exercises     Assessment/Plan    PT Assessment Patient needs continued PT services  PT Problem List Decreased strength;Decreased activity tolerance;Decreased balance;Decreased mobility;Decreased knowledge of use of DME;Decreased safety awareness       PT Treatment Interventions DME instruction;Gait training;Stair training;Functional mobility training;Therapeutic activities;Therapeutic exercise;Balance training;Neuromuscular re-education;Patient/family education    PT Goals (Current goals can be found in the Care Plan section)  Acute Rehab PT Goals Patient Stated Goal: get stronger and then go home PT Goal Formulation: With patient Time For Goal Achievement: 02/03/21 Potential to Achieve Goals: Good    Frequency Min 2X/week   Barriers to discharge        Co-evaluation               AM-PAC PT "6 Clicks" Mobility  Outcome Measure Help needed turning from your back to your side while in a flat bed without using bedrails?: A Little Help needed moving from lying on your back to sitting on the side of a flat bed without using bedrails?: A Little Help needed moving to and from a bed to a chair (including a wheelchair)?: A Little Help needed standing up from a chair using your arms (e.g., wheelchair or bedside chair)?: A Little Help needed to walk in hospital room?: A Little Help needed climbing 3-5 steps with a railing? : A Little 6 Click Score: 18    End of  Session Equipment Utilized During Treatment: Gait belt Activity Tolerance: Patient tolerated treatment well Patient left: in chair;with call bell/phone within reach (on commode, instructed to pull call bell, nursing notified) Nurse Communication: Mobility status PT Visit Diagnosis: Muscle weakness (generalized) (M62.81);Difficulty in walking, not elsewhere classified (R26.2);Unsteadiness on feet (R26.81)    Time: 4196-2229 PT Time Calculation (min) (ACUTE ONLY): 25 min   Charges:   PT Evaluation $PT Eval Low Complexity: 1 Low PT Treatments $Gait Training: 8-22 mins        Malachi Pro, DPT 01/20/2021, 10:52 AM

## 2021-01-21 DIAGNOSIS — K7460 Unspecified cirrhosis of liver: Secondary | ICD-10-CM | POA: Diagnosis not present

## 2021-01-21 DIAGNOSIS — R188 Other ascites: Secondary | ICD-10-CM | POA: Diagnosis not present

## 2021-01-21 LAB — COMPREHENSIVE METABOLIC PANEL
ALT: 11 U/L (ref 0–44)
AST: 29 U/L (ref 15–41)
Albumin: 3.3 g/dL — ABNORMAL LOW (ref 3.5–5.0)
Alkaline Phosphatase: 39 U/L (ref 38–126)
Anion gap: 7 (ref 5–15)
BUN: 18 mg/dL (ref 8–23)
CO2: 33 mmol/L — ABNORMAL HIGH (ref 22–32)
Calcium: 9.1 mg/dL (ref 8.9–10.3)
Chloride: 92 mmol/L — ABNORMAL LOW (ref 98–111)
Creatinine, Ser: 1.13 mg/dL — ABNORMAL HIGH (ref 0.44–1.00)
GFR, Estimated: 53 mL/min — ABNORMAL LOW (ref >60)
Glucose, Bld: 98 mg/dL (ref 70–99)
Potassium: 4.2 mmol/L (ref 3.5–5.1)
Sodium: 132 mmol/L — ABNORMAL LOW (ref 135–145)
Total Bilirubin: 1.4 mg/dL — ABNORMAL HIGH (ref 0.3–1.2)
Total Protein: 6.1 g/dL — ABNORMAL LOW (ref 6.5–8.1)

## 2021-01-21 LAB — CBC
HCT: 39 % (ref 36.0–46.0)
Hemoglobin: 12.8 g/dL (ref 12.0–15.0)
MCH: 26.6 pg (ref 26.0–34.0)
MCHC: 32.8 g/dL (ref 30.0–36.0)
MCV: 81.1 fL (ref 80.0–100.0)
Platelets: 67 10*3/uL — ABNORMAL LOW (ref 150–400)
RBC: 4.81 MIL/uL (ref 3.87–5.11)
RDW: 17.1 % — ABNORMAL HIGH (ref 11.5–15.5)
WBC: 3.4 10*3/uL — ABNORMAL LOW (ref 4.0–10.5)
nRBC: 0 % (ref 0.0–0.2)

## 2021-01-21 LAB — MAGNESIUM: Magnesium: 1.9 mg/dL (ref 1.7–2.4)

## 2021-01-21 NOTE — Progress Notes (Signed)
Physical Therapy Treatment Patient Details Name: Joan Roman MRN: [id number] DOB: July 25, 1950 Today's Date: 01/21/2021    History of Present Illness 70 year old with no known past medical history admitted to the hospital for progressive bilateral lower extremity swelling for about 1 and half months, abdominal swelling found to have new onset of decompensated cirrhosis.  During hospitalization underwent large-volume paracentesis on 8/17, 8/19 complicated by soft blood pressure and copious loose stools.    PT Comments    Pt without c/o dizziness this session sitting BP 101/62,  HR 69bpm,  Standing at RW x 2 minutes 99/73,  HR 81bpm.  Pt able to complete bed mobility with Supervision and increased time, transfers with CG/SBA and use of RW.  Short distance gait in room with RW ~102ft with SBA.  Good demonstration of standing balance and safety awareness. Pt attempted one 8" step with B UE support requiring Mod/MinA to raise up onto step.  Pt will need assistance upon d/c to brother's home which has ~6 steps and 1 railing.     Follow Up Recommendations  Home health PT;Supervision - Intermittent (Pt to live with brother once d/c'd from hospital)     Equipment Recommendations  Rolling walker with 5" wheels;3in1 (PT)    Recommendations for Other Services       Precautions / Restrictions Precautions Precautions: Fall Restrictions Weight Bearing Restrictions: No    Mobility  Bed Mobility Overal bed mobility: Needs Assistance Bed Mobility: Supine to Sit;Sit to Supine Rolling: Modified independent (Device/Increase time)   Supine to sit: Supervision;HOB elevated Sit to supine: Supervision   General bed mobility comments: significant increased time/effort but able to perform with encouragement and heavy BUE use of bed rails    Transfers Overall transfer level: Needs assistance Equipment used: Rolling walker (2 wheeled) Transfers: Sit to/from Stand Sit to Stand: Min guard          General transfer comment: cues for positioning to support safety  Ambulation/Gait Ambulation/Gait assistance: Min guard Gait Distance (Feet): 15 Feet Assistive device: Rolling walker (2 wheeled) Gait Pattern/deviations: Step-to pattern         Stairs Stairs: Yes Stairs assistance: Mod assist Stair Management: Two rails Number of Stairs: 1 (Pt struggled to raise up one step with B UE's supported on RW and Mod/MinA) General stair comments: Pt's brother has ~6 steps to enter with only one rail to hold onto at a time.   Wheelchair Mobility    Modified Rankin (Stroke Patients Only)       Balance Overall balance assessment: Needs assistance Sitting-balance support: No upper extremity supported Sitting balance-Leahy Scale: Good     Standing balance support: Bilateral upper extremity supported Standing balance-Leahy Scale: Fair                              Cognition Arousal/Alertness: Awake/alert Behavior During Therapy: WFL for tasks assessed/performed Overall Cognitive Status: Within Functional Limits for tasks assessed                                 General Comments: talkative, cues to redirect      Exercises General Exercises - Lower Extremity Ankle Circles/Pumps: AROM;Both;15 reps Long Arc Quad: AROM;Both;10 reps Other Exercises Other Exercises: OT facilitated RW mgt, negotiating obstacles in room, toileting from [facility] over toilet, handwashing at sink, and bed mobility.    General Comments  Pertinent Vitals/Pain Pain Assessment: No/denies pain    Home Living                      Prior Function            PT Goals (current goals can now be found in the care plan section) Acute Rehab PT Goals Patient Stated Goal: go to my brother's in [place]    Frequency    Min 2X/week      PT Plan      Co-evaluation              AM-PAC PT "6 Clicks" Mobility   Outcome Measure  Help needed turning from  your back to your side while in a flat bed without using bedrails?: A Little Help needed moving from lying on your back to sitting on the side of a flat bed without using bedrails?: A Little Help needed moving to and from a bed to a chair (including a wheelchair)?: A Little Help needed standing up from a chair using your arms (e.g., wheelchair or bedside chair)?: A Little Help needed to walk in hospital room?: A Little Help needed climbing 3-5 steps with a railing? : A Little 6 Click Score: 18    End of Session Equipment Utilized During Treatment: Gait belt Activity Tolerance: Patient tolerated treatment well Patient left: in chair;with call bell/phone within reach Nurse Communication: Mobility status PT Visit Diagnosis: Muscle weakness (generalized) (M62.81);Difficulty in walking, not elsewhere classified (R26.2);Unsteadiness on feet (R26.81)     Time: 6812-7517 PT Time Calculation (min) (ACUTE ONLY): 40 min  Charges:  $Gait Training: 8-22 mins $Therapeutic Exercise: 8-22 mins $Therapeutic Activity: 8-22 mins                    Zadie Cleverly, PTA    [name] 01/21/2021, 2:27 PM

## 2021-01-21 NOTE — Progress Notes (Signed)
    Referring Physician(s): Midge Minium, MD  Supervising Physician: Pernell Dupre  Patient Status:  [facility] - In-pt  IR aware of request for TIPS evaluation. I have spoke with the patient today about the TIPS procedure and the additional outpatient work-up needed. She is agreeable to schedule a consultation with our VIR clinic once discharged. Our office will contact her regarding scheduling once she is discharged.   Dr. Juliette Alcide is aware of this patient and has reviewed her labs, US liver, CT imaging and echo.   MELD score (15 pts if you use Na of 134), echo with EF 65-70% and RV function normal, liver doppler with patent portal vein.    Thank you for this referral, our clinic will contact the patient.  [facility] [address]  [address]  [place] [address] [phone]    Berneta Levins, PA-C 01/21/2021, 4:36 PM

## 2021-01-21 NOTE — Progress Notes (Signed)
Occupational Therapy Treatment Patient Details Name: Joan Roman MRN: [id number] DOB: 1951-04-24 Today's Date: 01/21/2021    History of present illness 70 year old with no known past medical history admitted to the hospital for progressive bilateral lower extremity swelling for about 1 and half months, abdominal swelling found to have new onset of decompensated cirrhosis.  During hospitalization underwent large-volume paracentesis on 8/17, 8/19 complicated by soft blood pressure and copious loose stools.   OT comments  Pt seen for OT tx this date. Pt endorses less frequent and more solid BMs today. Has foley cath in place. Agreeable to session. Pt performed bed mobility with increased time/effort with heavy use of bed rails but no direct assist required. VC for RW mgt/sequencing for ADL transfers from EOB and from [facility] over toilet. Pt performed pericare in standing with UE support on RW and CGA for safety. Pt endorsed once back to bed that her legs were starting to feel like "jello" and endorsed history of blood pressure issues causing her to become dizzy. Pt denied dizziness but did note her legs feeling weaker by end of session. (PTA notified for future session to check orthostatics). Pt educated in need to relay symptoms in the future and verbalized understanding. OT facilitated problem solving with pt who plans to stay with her brother in [place], [place] for a period of time after this hospitalization and while her back door is repaired after EMS had to break it to get to her. Pt demonstrating improved mobility/ADL efforts this date. Discharge recommendation upgraded to [facility] based on progress.    Follow Up Recommendations  Home health OT;Supervision - Intermittent    Equipment Recommendations  3 in 1 bedside commode;Tub/shower bench;Other (comment) (2WW)    Recommendations for Other Services      Precautions / Restrictions Precautions Precautions: Fall Restrictions Weight Bearing  Restrictions: No       Mobility Bed Mobility Overal bed mobility: Needs Assistance Bed Mobility: Supine to Sit;Sit to Supine     Supine to sit: Supervision;HOB elevated Sit to supine: Supervision   General bed mobility comments: significant increased time/effort but able to perform with encouragement and heavy BUE use of bed rails    Transfers Overall transfer level: Needs assistance Equipment used: Rolling walker (2 wheeled) Transfers: Sit to/from Stand Sit to Stand: Min guard         General transfer comment: cues for positioning to support safety    Balance Overall balance assessment: Needs assistance Sitting-balance support: No upper extremity supported Sitting balance-Leahy Scale: Good     Standing balance support: Bilateral upper extremity supported Standing balance-Leahy Scale: Fair                             ADL either performed or assessed with clinical judgement   ADL Overall ADL's : Needs assistance/impaired     Grooming: Wash/dry hands;Standing;Min guard;Supervision/safety Grooming Details (indicate cue type and reason): BUE forearm support on sink while washing, supervision/CGA for safety                 Toilet Transfer: Min guard;RW;Ambulation;Comfort height toilet Toilet Transfer Details (indicate cue type and reason): BSC over toilet, RW, 1 VC for hand placement/RW mgt Toileting- Clothing Manipulation and Hygiene: Sit to/from stand;Min guard Toileting - Clothing Manipulation Details (indicate cue type and reason): CGA, increased wiping for thoroughness     Functional mobility during ADLs: Min guard;Rolling walker       Vision  Perception     Praxis      Cognition Arousal/Alertness: Awake/alert Behavior During Therapy: WFL for tasks assessed/performed Overall Cognitive Status: Within Functional Limits for tasks assessed                                 General Comments: talkative, cues to  redirect        Exercises Other Exercises Other Exercises: OT facilitated RW mgt, negotiating obstacles in room, toileting from [facility] over toilet, handwashing at sink, and bed mobility.   Shoulder Instructions       General Comments      Pertinent Vitals/ Pain       Pain Assessment: No/denies pain  Home Living                                          Prior Functioning/Environment              Frequency  Min 2X/week        Progress Toward Goals  OT Goals(current goals can now be found in the care plan section)  Progress towards OT goals: Progressing toward goals  Acute Rehab OT Goals Patient Stated Goal: get stronger and then go home OT Goal Formulation: With patient Time For Goal Achievement: 02/02/21 Potential to Achieve Goals: Good  Plan Frequency remains appropriate;Discharge plan needs to be updated    Co-evaluation                 AM-PAC OT "6 Clicks" Daily Activity     Outcome Measure   Help from another person eating meals?: None Help from another person taking care of personal grooming?: A Little Help from another person toileting, which includes using toliet, bedpan, or urinal?: A Little Help from another person bathing (including washing, rinsing, drying)?: A Little Help from another person to put on and taking off regular upper body clothing?: None Help from another person to put on and taking off regular lower body clothing?: A Little 6 Click Score: 20    End of Session    OT Visit Diagnosis: Other abnormalities of gait and mobility (R26.89);Muscle weakness (generalized) (M62.81)   Activity Tolerance Patient tolerated treatment well   Patient Left in bed;with call bell/phone within reach;with bed alarm set   Nurse Communication          Time: 3664-4034 OT Time Calculation (min): 39 min  Charges: OT General Charges $OT Visit: 1 Visit OT Treatments $Self Care/Home Management : 38-52 mins  Wynona Canes,  MPH, MS, OTR/L ascom [phone] 01/21/21, 1:44 PM

## 2021-01-21 NOTE — TOC Progression Note (Signed)
Transition of Care [facility]) - Progression Note    Patient Details  Name: AMARIS DELAFUENTE MRN: [id number] Date of Birth: 1951/05/11  Transition of Care [facility] CM/SW Contact  Allayne Butcher, RN Phone Number: 01/21/2021, 2:27 PM  Clinical Narrative:    Patient reports that she will be going to her brother's home in [place] at discharge to stay with him for a little while.  Patient agrees to home health services.  Barbara Cower with [facility] given referral for [facility] RN, PT, OT, and aide.  RW and 3 in1 ordered from [employer] to be delivered to the room.  Patient is still not medically ready for DC at this time.     Expected Discharge Plan: Home w Home Health Services Barriers to Discharge: Continued Medical Work up  Expected Discharge Plan and Services Expected Discharge Plan: Home w Home Health Services   Discharge Planning Services: CM Consult Post Acute Care Choice: Home Health Living arrangements for the past 2 months: Single Family Home                 DME Arranged: 3-N-1, Walker rolling DME Agency: [facility] Date DME Agency Contacted: 01/21/21 Time DME Agency Contacted: [phone] Representative spoke with at DME Agency: Bjorn Loser HH Arranged: RN, PT, OT, Nurse's Aide HH Agency: [facility] Date HH Agency Contacted: 01/21/21 Time HH Agency Contacted: [phone] Representative spoke with at [facility] Agency: Barbara Cower   Social Determinants of Health (SDOH) Interventions    Readmission Risk Interventions No flowsheet data found.

## 2021-01-21 NOTE — Progress Notes (Signed)
PROGRESS NOTE    Joan Roman  STM:[id number] DOB: [birth year] DOA: 01/12/2021 PCP: Pcp, No   Brief Narrative:  70 year old with no known past medical history admitted to the hospital for progressive bilateral lower extremity swelling for about 1 and half months, abdominal swelling found to have new onset of decompensated cirrhosis.  During hospitalization underwent large-volume paracentesis on 8/17, 8/19 complicated by soft blood pressure.  Received IV albumin and midodrine.  GI team was consulted, Lasix drip was discontinued and and started oral Aldactone with plans to slowly start lasix.   Assessment & Plan:   Principal Problem:   Abdominal ascites Active Problems:   Obesity, Class III, BMI 40-49.9 (morbid obesity) (HCC)   Pleural effusion   Thrombocytopenia (HCC)   Liver cirrhosis (HCC)   Ascites of liver  Decompensated liver cirrhosis with ascites, resolving Thrombocytopenia, hypoalbuminemia - Unknown etiology - acute hepatitis panel negative, TSH normal, HIV nonreactive.   - Autoimmune work-up ongoing with GI.  Echocardiogram shows EF 65 to 70%.  Lipid panel normal, A1c 4.4. Status post paracentesis 8/17, 8/19, 8/23 status post albumin.  On midodrine 10 mg 3 times daily. - Currently on Aldactone. If pressures remain stable over the next 24 hours we will add low-dose Lasix and titrate to tolerance - somewhat symptomatic today with PT/ambulation - will hold lasix for another 24 hours.  Diarrhea, improving -Improving, unlikely infectious, patient transiently on antibiotics for UTI as below but labs not consistent with C. Difficile -possibly viral  Hypokalemia -Continue to follow, currently WNL  Trace Right sided pleural effusion - Likely from underlying liver cirrhosis  Vitamin D deficiency - Oral supplements  Folate deficiency Borderline vitamin B12 deficiency -Continue vitamin supplement  Urinary tract infection with Klebsiella -Completed course with Keflex  DVT  prophylaxis: Place TED hose Start: 01/12/21 1842 Code Status: Full Family Communication:  Patient to update  Status is: Inpatient  Remains inpatient appropriate because:Inpatient level of care appropriate due to severity of illness  Dispo: The patient is from: Home              Anticipated d/c is to: Home              Patient currently is not medically stable to d/c.  Still undergoing fluid management, may require repeat paracentesis in the next 24 to 48 hours  Subjective: Diarrhea resolving denies nausea vomiting constipation headache fevers chills or chest pain; abdominal pain/distention improving  Examination: Constitutional: Not in acute distress Respiratory: Clear to auscultation bilaterally Cardiovascular: Normal sinus rhythm, no rubs Abdomen: Soft nontender with minimal fluid wave Musculoskeletal: 1+ bilateral lower extremity pitting edema Skin: No rashes seen Neurologic: CN 2-12 grossly intact.  And nonfocal Psychiatric: Normal judgment and insight. Alert and oriented x 3. Normal mood.  Objective: Vitals:   01/20/21 1757 01/20/21 2007 01/21/21 0116 01/21/21 0443  BP: 118/71 (!) 105/57 91/61 (!) 95/48  Pulse: 66 67 74 66  Resp: 17 18 18 18   Temp: 98.3 F (36.8 C) 98.1 F (36.7 C) 98.7 F (37.1 C) 98.1 F (36.7 C)  TempSrc: Oral     SpO2: 97% 95% 96% 99%  Weight:      Height:        Intake/Output Summary (Last 24 hours) at 01/21/2021 0734 Last data filed at 01/21/2021 0500 Gross per 24 hour  Intake 360 ml  Output 350 ml  Net 10 ml    Filed Weights   01/12/21 1353 01/12/21 2111  Weight: 111.1 kg 111.6 kg  Data Reviewed:   CBC: Recent Labs  Lab 01/15/21 0422 01/16/21 0418 01/17/21 0542 01/20/21 0527 01/21/21 0456  WBC 3.9* 3.9* 3.4* 3.1* 3.4*  HGB 12.8 12.8 12.5 11.7* 12.8  HCT 38.5 39.0 38.3 36.2 39.0  MCV 80.0 78.2* 80.3 80.6 81.1  PLT 62* 65* 66* 62* 67*    Basic Metabolic Panel: Recent Labs  Lab 01/15/21 0422 01/16/21 0418  01/17/21 0542 01/18/21 0555 01/19/21 0439 01/20/21 0527 01/21/21 0456  NA 136 136 136 138 137 134* 132*  K 3.2* 2.9* 3.0* 3.6 3.4* 3.5 4.2  CL 95* 94* 90* 95* 93* 92* 92*  CO2 31 32 33* 35* 34* 33* 33*  GLUCOSE 95 98 91 95 112* 114* 98  BUN 18 16 15 16 18 18 18   CREATININE 1.18* 1.08* 0.90 1.02* 1.03* 1.10* 1.13*  CALCIUM 8.3* 8.0* 8.0* 8.5* 8.6* 8.9 9.1  MG 1.7 1.5* 1.6* 1.8 1.9 1.9 1.9  PHOS 3.4 2.7 2.5  --   --   --   --     GFR: Estimated Creatinine Clearance: 58.3 mL/min (A) (by C-G formula based on SCr of 1.13 mg/dL (H)). Liver Function Tests: Recent Labs  Lab 01/16/21 0418 01/18/21 0555 01/19/21 0439 01/20/21 0527 01/21/21 0456  AST 19 23 26 25 29   ALT 6 8 8 10 11   ALKPHOS 35* 36* 41 32* 39  BILITOT 1.8* 2.0* 1.4* 1.2 1.4*  PROT 5.2* 5.5* 5.5* 5.8* 6.1*  ALBUMIN 2.8* 3.1* 2.9* 3.5 3.3*    No results for input(s): LIPASE, AMYLASE in the last 168 hours. No results for input(s): AMMONIA in the last 168 hours.  Coagulation Profile: Recent Labs  Lab 01/18/21 0751  INR 1.3*    Cardiac Enzymes: No results for input(s): CKTOTAL, CKMB, CKMBINDEX, TROPONINI in the last 168 hours. BNP (last 3 results) No results for input(s): PROBNP in the last 8760 hours. HbA1C: No results for input(s): HGBA1C in the last 72 hours. CBG: Recent Labs  Lab 01/17/21 0717 01/17/21 1146 01/17/21 2026 01/18/21 0816 01/18/21 1122  GLUCAP 89 103* 101* 86 95    Lipid Profile: No results for input(s): CHOL, HDL, LDLCALC, TRIG, CHOLHDL, LDLDIRECT in the last 72 hours.  Thyroid Function Tests: No results for input(s): TSH, T4TOTAL, FREET4, T3FREE, THYROIDAB in the last 72 hours. Anemia Panel: No results for input(s): VITAMINB12, FOLATE, FERRITIN, TIBC, IRON, RETICCTPCT in the last 72 hours. Sepsis Labs: No results for input(s): PROCALCITON, LATICACIDVEN in the last 168 hours.  Recent Results (from the past 240 hour(s))  Resp Panel by RT-PCR (Flu A&B, Covid) Nasopharyngeal  Swab     Status: None   Collection Time: 01/12/21  5:20 PM   Specimen: Nasopharyngeal Swab; Nasopharyngeal(NP) swabs in vial transport medium  Result Value Ref Range Status   SARS Coronavirus 2 by RT PCR NEGATIVE NEGATIVE Final    Comment: (NOTE) SARS-CoV-2 target nucleic acids are NOT DETECTED.  The SARS-CoV-2 RNA is generally detectable in upper respiratory specimens during the acute phase of infection. The lowest concentration of SARS-CoV-2 viral copies this assay can detect is 138 copies/mL. A negative result does not preclude SARS-Cov-2 infection and should not be used as the sole basis for treatment or other patient management decisions. A negative result may occur with  improper specimen collection/handling, submission of specimen other than nasopharyngeal swab, presence of viral mutation(s) within the areas targeted by this assay, and inadequate number of viral copies(<138 copies/mL). A negative result must be combined with clinical observations, patient history, and  epidemiological information. The expected result is Negative.  Fact Sheet for Patients:  BloggerCourse.com  Fact Sheet for Healthcare Providers:  SeriousBroker.it  This test is no t yet approved or cleared by the [place] FDA and  has been authorized for detection and/or diagnosis of SARS-CoV-2 by FDA under an Emergency Use Authorization (EUA). This EUA will remain  in effect (meaning this test can be used) for the duration of the COVID-19 declaration under Section 564(b)(1) of the Act, 21 U.S.C.section 360bbb-3(b)(1), unless the authorization is terminated  or revoked sooner.       Influenza A by PCR NEGATIVE NEGATIVE Final   Influenza B by PCR NEGATIVE NEGATIVE Final    Comment: (NOTE) The Xpert Xpress SARS-CoV-2/FLU/RSV plus assay is intended as an aid in the diagnosis of influenza from Nasopharyngeal swab specimens and should not be used as a sole  basis for treatment. Nasal washings and aspirates are unacceptable for Xpert Xpress SARS-CoV-2/FLU/RSV testing.  Fact Sheet for Patients: BloggerCourse.com  Fact Sheet for Healthcare Providers: SeriousBroker.it  This test is not yet approved or cleared by the [place] FDA and has been authorized for detection and/or diagnosis of SARS-CoV-2 by FDA under an Emergency Use Authorization (EUA). This EUA will remain in effect (meaning this test can be used) for the duration of the COVID-19 declaration under Section 564(b)(1) of the Act, 21 U.S.C. section 360bbb-3(b)(1), unless the authorization is terminated or revoked.  Performed at [facility], [address] [place], [place] [address]   Peritoneal fluid culture w Gram Stain     Status: None   Collection Time: 01/12/21  7:10 PM   Specimen: Peritoneal Washings; Peritoneal Fluid  Result Value Ref Range Status   Specimen Description   Final    PERITONEAL FLUID Performed at [facility], [address] [place], [place] [address]    Special Requests   Final    NONE Performed at [facility], [address] [place], [place] [address]    Gram Stain   Final    FEW SQUAMOUS EPITHELIAL CELLS PRESENT MODERATE WBC SEEN NO ORGANISMS SEEN    Culture   Final    NO GROWTH 3 DAYS Performed at [facility], [address], [place], [place] [address]    Report Status 01/16/2021 FINAL  Final  Urine Culture     Status: Abnormal   Collection Time: 01/13/21  8:28 AM   Specimen: Urine, Clean Catch  Result Value Ref Range Status   Specimen Description   Final    URINE, CLEAN CATCH Performed at [facility], [address], [place], [place] [address]    Special Requests   Final    NONE Performed at [facility], [address] [place], [place] [address]    Culture >=100,000 COLONIES/mL KLEBSIELLA PNEUMONIAE (A)  Final   Report  Status 01/15/2021 FINAL  Final   Organism ID, Bacteria KLEBSIELLA PNEUMONIAE (A)  Final      Susceptibility   Klebsiella pneumoniae - MIC*    AMPICILLIN >=32 RESISTANT Resistant     CEFAZOLIN <=4 SENSITIVE Sensitive     CEFEPIME <=0.12 SENSITIVE Sensitive     CEFTRIAXONE <=0.25 SENSITIVE Sensitive     CIPROFLOXACIN <=0.25 SENSITIVE Sensitive     GENTAMICIN <=1 SENSITIVE Sensitive     IMIPENEM <=0.25 SENSITIVE Sensitive     NITROFURANTOIN 64 INTERMEDIATE Intermediate     TRIMETH/SULFA <=20 SENSITIVE Sensitive     AMPICILLIN/SULBACTAM 4 SENSITIVE Sensitive  PIP/TAZO <=4 SENSITIVE Sensitive     * >=100,000 COLONIES/mL KLEBSIELLA PNEUMONIAE  Body fluid culture w Gram Stain     Status: None (Preliminary result)   Collection Time: 01/19/21  4:00 PM   Specimen: PATH Cytology Peritoneal fluid  Result Value Ref Range Status   Specimen Description   Final    PERITONEAL Performed at [facility], [address], [place], [place] [address]    Special Requests   Final    NONE Performed at [facility], [address] [place], [place] [address]    Gram Stain   Final    NO ORGANISMS SEEN SQUAMOUS EPITHELIAL CELLS PRESENT FEW WBC PRESENT, PREDOMINANTLY MONONUCLEAR NO ORGANISMS SEEN BACTERIA    Culture   Final    NO GROWTH < 12 HOURS Performed at [facility], [address], [place], [place] [address]    Report Status PENDING  Incomplete          Radiology Studies: US Paracentesis  Result Date: 01/20/2021 INDICATION: Ascites with request received for paracentesis. EXAM: ULTRASOUND GUIDED diagnostic and therapeutic PARACENTESIS MEDICATIONS: Local 1% lidocaine only. COMPLICATIONS: None immediate. PROCEDURE: Informed written consent was obtained from the patient after a discussion of the risks, benefits and alternatives to treatment. A timeout was performed prior to the initiation of the procedure. Initial ultrasound scanning demonstrates a large amount of  ascites within the right lower abdominal quadrant. The right lower abdomen was prepped and draped in the usual sterile fashion. 1% lidocaine was used for local anesthesia. Following this, a 6 Fr Safe-T-Centesis catheter was introduced. An ultrasound image was saved for documentation purposes. The paracentesis was performed. The catheter was removed and a dressing was applied. The patient tolerated the procedure well without immediate post procedural complication. The procedure was stopped secondary to hypotension. FINDINGS: A total of approximately 5,000 mL of clear yellow fluid was removed. Samples were sent to the laboratory as requested by the clinical team. IMPRESSION: Successful ultrasound-guided paracentesis yielding 5 liters of peritoneal fluid. Read By: Pattricia Boss PA-C Electronically Signed   By: Olive Bass M.D.   On: 01/19/2021 16:46     Scheduled Meds:  Chlorhexidine Gluconate Cloth  6 each Topical Daily   folic acid  1 mg Oral Daily   midodrine  10 mg Oral TID WC   nystatin   Topical TID   spironolactone  100 mg Oral Daily   vitamin B-12  500 mcg Oral Daily   Vitamin D (Ergocalciferol)  50,000 Units Oral Q7 days   Continuous Infusions:  albumin human       LOS: 8 days   Time spent= 35 mins  Azucena Fallen, DO [facility]  If 7PM-7AM, please contact night-coverage  01/21/2021, 7:34 AM

## 2021-01-21 NOTE — Progress Notes (Signed)
Joan Minium, MD [facility]   [address], [address] [place], [place] [address] Phone: [phone] Fax : [phone]   Subjective: The patient continues to accumulate ascitic fluid due to her liver disease.  The patient also has had her creatinine increase on diuretics.  The patient is sitting up in bed in no apparent distress at the present time but does report that her abdominal fluid is reaccumulating.   Objective: Vital signs in last 24 hours: Vitals:   01/21/21 0116 01/21/21 0443 01/21/21 0758 01/21/21 1100  BP: 91/61 (!) 95/48 98/60 102/63  Pulse: 74 66 63 65  Resp: 18 18 18 18   Temp: 98.7 F (37.1 C) 98.1 F (36.7 C) 97.6 F (36.4 C) 97.8 F (36.6 C)  TempSrc:      SpO2: 96% 99% 96% 97%  Weight:      Height:       Weight change:   Intake/Output Summary (Last 24 hours) at 01/21/2021 1509 Last data filed at 01/21/2021 1405 Gross per 24 hour  Intake 420 ml  Output 350 ml  Net 70 ml     Exam: General: Sitting in bed in no apparent distress with obvious distention of her abdomen with ascites   Lab Results: @LABTEST2 @ Micro Results: Recent Results (from the past 240 hour(s))  Resp Panel by RT-PCR (Flu A&B, Covid) Nasopharyngeal Swab     Status: None   Collection Time: 01/12/21  5:20 PM   Specimen: Nasopharyngeal Swab; Nasopharyngeal(NP) swabs in vial transport medium  Result Value Ref Range Status   SARS Coronavirus 2 by RT PCR NEGATIVE NEGATIVE Final    Comment: (NOTE) SARS-CoV-2 target nucleic acids are NOT DETECTED.  The SARS-CoV-2 RNA is generally detectable in upper respiratory specimens during the acute phase of infection. The lowest concentration of SARS-CoV-2 viral copies this assay can detect is 138 copies/mL. A negative result does not preclude SARS-Cov-2 infection and should not be used as the sole basis for treatment or other patient management decisions. A negative result may occur with  improper specimen collection/handling, submission of specimen  other than nasopharyngeal swab, presence of viral mutation(s) within the areas targeted by this assay, and inadequate number of viral copies(<138 copies/mL). A negative result must be combined with clinical observations, patient history, and epidemiological information. The expected result is Negative.  Fact Sheet for Patients:   Fact Sheet for Healthcare Providers:  01/14/21  This test is no t yet approved or cleared by the [place] FDA and  has been authorized for detection and/or diagnosis of SARS-CoV-2 by FDA under an Emergency Use Authorization (EUA). This EUA will remain  in effect (meaning this test can be used) for the duration of the COVID-19 declaration under Section 564(b)(1) of the Act, 21 U.S.C.section 360bbb-3(b)(1), unless the authorization is terminated  or revoked sooner.       Influenza A by PCR NEGATIVE NEGATIVE Final   Influenza B by PCR NEGATIVE NEGATIVE Final    Comment: (NOTE) The Xpert Xpress SARS-CoV-2/FLU/RSV plus assay is intended as an aid in the diagnosis of influenza from Nasopharyngeal swab specimens and should not be used as a sole basis for treatment. Nasal washings and aspirates are unacceptable for Xpert Xpress SARS-CoV-2/FLU/RSV testing.  Fact Sheet for Patients: SeriousBroker.it  Fact Sheet for Healthcare Providers: [place]  This test is not yet approved or cleared by the [employer] FDA and has been authorized for detection and/or diagnosis of SARS-CoV-2 by FDA under an Emergency Use Authorization (EUA). This EUA will  remain in effect (meaning this test can be used) for the duration of the COVID-19 declaration under Section 564(b)(1) of the Act, 21 U.S.C. section 360bbb-3(b)(1), unless the authorization is terminated or revoked.  Performed at [facility], [address],  [place], [place] [address]   Peritoneal fluid culture w Gram Stain     Status: None   Collection Time: 01/12/21  7:10 PM   Specimen: Peritoneal Washings; Peritoneal Fluid  Result Value Ref Range Status   Specimen Description   Final    PERITONEAL FLUID Performed at [facility], [address], [place], [place] [address]    Special Requests   Final    NONE Performed at [facility], [address] [place], [place] [address]    Gram Stain   Final    FEW SQUAMOUS EPITHELIAL CELLS PRESENT MODERATE WBC SEEN NO ORGANISMS SEEN    Culture   Final    NO GROWTH 3 DAYS Performed at [facility], [address], [place], [place] [address]    Report Status 01/16/2021 FINAL  Final  Urine Culture     Status: Abnormal   Collection Time: 01/13/21  8:28 AM   Specimen: Urine, Clean Catch  Result Value Ref Range Status   Specimen Description   Final    URINE, CLEAN CATCH Performed at [facility], [address], [place], [place] [address]    Special Requests   Final    NONE Performed at [facility], [address] [place], [place] [address]    Culture >=100,000 COLONIES/mL KLEBSIELLA PNEUMONIAE (A)  Final   Report Status 01/15/2021 FINAL  Final   Organism ID, Bacteria KLEBSIELLA PNEUMONIAE (A)  Final      Susceptibility   Klebsiella pneumoniae - MIC*    AMPICILLIN >=32 RESISTANT Resistant     CEFAZOLIN <=4 SENSITIVE Sensitive     CEFEPIME <=0.12 SENSITIVE Sensitive     CEFTRIAXONE <=0.25 SENSITIVE Sensitive     CIPROFLOXACIN <=0.25 SENSITIVE Sensitive     GENTAMICIN <=1 SENSITIVE Sensitive     IMIPENEM <=0.25 SENSITIVE Sensitive     NITROFURANTOIN 64 INTERMEDIATE Intermediate     TRIMETH/SULFA <=20 SENSITIVE Sensitive     AMPICILLIN/SULBACTAM 4 SENSITIVE Sensitive     PIP/TAZO <=4 SENSITIVE Sensitive     * >=100,000 COLONIES/mL KLEBSIELLA PNEUMONIAE  Body fluid culture w Gram Stain     Status: None (Preliminary result)   Collection Time:  01/19/21  4:00 PM   Specimen: PATH Cytology Peritoneal fluid  Result Value Ref Range Status   Specimen Description   Final    PERITONEAL Performed at [facility], [address], [place], [place] [address]    Special Requests   Final    NONE Performed at [facility], [address] [place], [place] [address]    Gram Stain   Final    NO ORGANISMS SEEN SQUAMOUS EPITHELIAL CELLS PRESENT FEW WBC PRESENT, PREDOMINANTLY MONONUCLEAR NO ORGANISMS SEEN BACTERIA    Culture   Final    NO GROWTH 2 DAYS Performed at [facility], [address], [place], [place] [address]    Report Status PENDING  Incomplete   Studies/Results: US Paracentesis  Result Date: 01/20/2021 INDICATION: Ascites with request received for paracentesis. EXAM: ULTRASOUND GUIDED diagnostic and therapeutic PARACENTESIS MEDICATIONS: Local 1% lidocaine only. COMPLICATIONS: None immediate. PROCEDURE: Informed written consent was obtained from the patient after a discussion of the risks, benefits and alternatives to  treatment. A timeout was performed prior to the initiation of the procedure. Initial ultrasound scanning demonstrates a large amount of ascites within the right lower abdominal quadrant. The right lower abdomen was prepped and draped in the usual sterile fashion. 1% lidocaine was used for local anesthesia. Following this, a 6 Fr Safe-T-Centesis catheter was introduced. An ultrasound image was saved for documentation purposes. The paracentesis was performed. The catheter was removed and a dressing was applied. The patient tolerated the procedure well without immediate post procedural complication. The procedure was stopped secondary to hypotension. FINDINGS: A total of approximately 5,000 mL of clear yellow fluid was removed. Samples were sent to the laboratory as requested by the clinical team. IMPRESSION: Successful ultrasound-guided paracentesis yielding 5 liters of peritoneal fluid. Read By:  Pattricia Boss PA-C Electronically Signed   By: Olive Bass M.D.   On: 01/19/2021 16:46   Medications: I have reviewed the patient's current medications. Scheduled Meds:  Chlorhexidine Gluconate Cloth  6 each Topical Daily   folic acid  1 mg Oral Daily   midodrine  10 mg Oral TID WC   nystatin   Topical TID   spironolactone  100 mg Oral Daily   vitamin B-12  500 mcg Oral Daily   Vitamin D (Ergocalciferol)  50,000 Units Oral Q7 days   Continuous Infusions:  albumin human     PRN Meds:.albumin human, dextromethorphan-guaiFENesin, liver oil-zinc oxide, ondansetron **OR** ondansetron (ZOFRAN) IV, senna-docusate, simethicone   Assessment: Principal Problem:   Abdominal ascites Active Problems:   Obesity, Class III, BMI 40-49.9 (morbid obesity) (HCC)   Pleural effusion   Thrombocytopenia (HCC)   Liver cirrhosis (HCC)   Ascites of liver    Plan: This patient is not doing well with conservative management such as recurrent paracentesis and diuretics.  With her decreased renal function I believe adding Lasix will make her renal function worse. A TIPS procedure through interventional radiology should be considered for this patient.   LOS: 8 days   Sherlyn Hay 01/21/2021, 3:09 PM Pager [phone] 7am-5pm  Check AMION for 5pm -7am coverage and on weekends

## 2021-01-22 ENCOUNTER — Inpatient Hospital Stay: Payer: Medicare Other

## 2021-01-22 DIAGNOSIS — R188 Other ascites: Secondary | ICD-10-CM | POA: Diagnosis not present

## 2021-01-22 LAB — CBC
HCT: 37.8 % (ref 36.0–46.0)
Hemoglobin: 12.2 g/dL (ref 12.0–15.0)
MCH: 25.6 pg — ABNORMAL LOW (ref 26.0–34.0)
MCHC: 32.3 g/dL (ref 30.0–36.0)
MCV: 79.2 fL — ABNORMAL LOW (ref 80.0–100.0)
Platelets: 64 10*3/uL — ABNORMAL LOW (ref 150–400)
RBC: 4.77 MIL/uL (ref 3.87–5.11)
RDW: 17.2 % — ABNORMAL HIGH (ref 11.5–15.5)
WBC: 3.6 10*3/uL — ABNORMAL LOW (ref 4.0–10.5)
nRBC: 0 % (ref 0.0–0.2)

## 2021-01-22 LAB — COMPREHENSIVE METABOLIC PANEL
ALT: 12 U/L (ref 0–44)
AST: 29 U/L (ref 15–41)
Albumin: 2.9 g/dL — ABNORMAL LOW (ref 3.5–5.0)
Alkaline Phosphatase: 39 U/L (ref 38–126)
Anion gap: 5 (ref 5–15)
BUN: 20 mg/dL (ref 8–23)
CO2: 33 mmol/L — ABNORMAL HIGH (ref 22–32)
Calcium: 8.5 mg/dL — ABNORMAL LOW (ref 8.9–10.3)
Chloride: 95 mmol/L — ABNORMAL LOW (ref 98–111)
Creatinine, Ser: 1.05 mg/dL — ABNORMAL HIGH (ref 0.44–1.00)
GFR, Estimated: 58 mL/min — ABNORMAL LOW (ref >60)
Glucose, Bld: 85 mg/dL (ref 70–99)
Potassium: 3.7 mmol/L (ref 3.5–5.1)
Sodium: 133 mmol/L — ABNORMAL LOW (ref 135–145)
Total Bilirubin: 1.4 mg/dL — ABNORMAL HIGH (ref 0.3–1.2)
Total Protein: 5.6 g/dL — ABNORMAL LOW (ref 6.5–8.1)

## 2021-01-22 LAB — MAGNESIUM: Magnesium: 1.8 mg/dL (ref 1.7–2.4)

## 2021-01-22 MED ORDER — FUROSEMIDE 20 MG PO TABS
20.0000 mg | ORAL_TABLET | Freq: Two times a day (BID) | ORAL | Status: DC
  Administered 2021-01-22 – 2021-01-23 (×2): 20 mg via ORAL
  Filled 2021-01-22 (×2): qty 1

## 2021-01-22 MED ORDER — BACITRACIN-NEOMYCIN-POLYMYXIN OINTMENT TUBE
TOPICAL_OINTMENT | CUTANEOUS | Status: DC | PRN
  Filled 2021-01-22: qty 14.17

## 2021-01-22 NOTE — Progress Notes (Signed)
PROGRESS NOTE    Joan Roman  ZOX:[id number] DOB: 1950/11/30 DOA: 01/12/2021 PCP: Pcp, No   Brief Narrative:  70 year old with no known past medical history admitted to the hospital for progressive bilateral lower extremity swelling for about 1 and half months, abdominal swelling found to have new onset of decompensated cirrhosis.  During hospitalization underwent large-volume paracentesis on 8/17, 8/19 complicated by soft blood pressure.  Received IV albumin and midodrine.  GI team was consulted, Lasix drip was discontinued and and started oral Aldactone with plans to slowly start lasix.   Assessment & Plan:   Principal Problem:   Abdominal ascites Active Problems:   Obesity, Class III, BMI 40-49.9 (morbid obesity) (HCC)   Pleural effusion   Thrombocytopenia (HCC)   Liver cirrhosis (HCC)   Ascites of liver  Decompensated liver cirrhosis with ascites, resolving Thrombocytopenia, hypoalbuminemia - Unknown etiology - acute hepatitis panel negative, TSH normal, HIV nonreactive.   - Autoimmune work-up ongoing with GI.  Echocardiogram shows EF 65 to 70%.  Lipid panel normal, A1c 4.4. Status post paracentesis 8/17, 8/19, 8/23 status post albumin. - Repeat paracentesis 8/26 - Continues on midodrine 10 mg 3 times daily. - Currently on Aldactone.  - Initiate trial of low dose po lasix 8/26 - 20mg  BID - Propranolol held due to borderline bradycardia at rest -Plan for outpatient follow-up with IR for TIPS procedure, once patient's volume status is stabilized will likely discharge home with close outpatient follow-up  Diarrhea, improving -Improving, unlikely infectious, patient transiently on antibiotics for UTI as below but labs not consistent with C. Difficile; no indication for stool culture/sample  Hypokalemia -Continue to follow, currently WNL  Trace Right sided pleural effusion - Likely from underlying liver cirrhosis  Vitamin D deficiency - Oral supplements  Folate  deficiency Borderline vitamin B12 deficiency -Continue vitamin supplement  Urinary tract infection with Klebsiella -Completed course with Keflex  DVT prophylaxis: Place TED hose Start: 01/12/21 1842 Code Status: Full Family Communication:  Patient to update  Status is: Inpatient  Remains inpatient appropriate because:Inpatient level of care appropriate due to severity of illness  Dispo: The patient is from: Home              Anticipated d/c is to: Home              Patient currently is not medically stable to d/c.  Still undergoing fluid management, may require repeat paracentesis in the next 24 to 48 hours  Subjective: Diarrhea resolving denies nausea vomiting constipation headache fevers chills or chest pain; abdominal pain/distention improving  Examination: Constitutional: Not in acute distress Respiratory: Clear to auscultation bilaterally Cardiovascular: Normal sinus rhythm, no rubs Abdomen: Soft nontender with minimal fluid wave Musculoskeletal: 1+ bilateral lower extremity pitting edema Skin: No rashes seen Neurologic: CN 2-12 grossly intact.  And nonfocal Psychiatric: Normal judgment and insight. Alert and oriented x 3. Normal mood.  Objective: Vitals:   01/21/21 1541 01/21/21 2052 01/22/21 0111 01/22/21 0545  BP: 112/68 101/61 98/64 109/62  Pulse: 67 66 63 72  Resp: 18 18 17 16   Temp:  98.2 F (36.8 C) 97.8 F (36.6 C) 98.8 F (37.1 C)  TempSrc:   Axillary   SpO2: 97% 97% 96% 94%  Weight:      Height:        Intake/Output Summary (Last 24 hours) at 01/22/2021 0727 Last data filed at 01/21/2021 1852 Gross per 24 hour  Intake 660 ml  Output --  Net 660 ml  Filed Weights   01/12/21 1353 01/12/21 2111  Weight: 111.1 kg 111.6 kg     Data Reviewed:   CBC: Recent Labs  Lab 01/16/21 0418 01/17/21 0542 01/20/21 0527 01/21/21 0456 01/22/21 0400  WBC 3.9* 3.4* 3.1* 3.4* 3.6*  HGB 12.8 12.5 11.7* 12.8 12.2  HCT 39.0 38.3 36.2 39.0 37.8  MCV  78.2* 80.3 80.6 81.1 79.2*  PLT 65* 66* 62* 67* 64*    Basic Metabolic Panel: Recent Labs  Lab 01/16/21 0418 01/17/21 0542 01/18/21 0555 01/19/21 0439 01/20/21 0527 01/21/21 0456 01/22/21 0400  NA 136 136 138 137 134* 132* 133*  K 2.9* 3.0* 3.6 3.4* 3.5 4.2 3.7  CL 94* 90* 95* 93* 92* 92* 95*  CO2 32 33* 35* 34* 33* 33* 33*  GLUCOSE 98 91 95 112* 114* 98 85  BUN 16 15 16 18 18 18 20   CREATININE 1.08* 0.90 1.02* 1.03* 1.10* 1.13* 1.05*  CALCIUM 8.0* 8.0* 8.5* 8.6* 8.9 9.1 8.5*  MG 1.5* 1.6* 1.8 1.9 1.9 1.9 1.8  PHOS 2.7 2.5  --   --   --   --   --     GFR: Estimated Creatinine Clearance: 62.7 mL/min (A) (by C-G formula based on SCr of 1.05 mg/dL (H)). Liver Function Tests: Recent Labs  Lab 01/18/21 0555 01/19/21 0439 01/20/21 0527 01/21/21 0456 01/22/21 0400  AST 23 26 25 29 29   ALT 8 8 10 11 12   ALKPHOS 36* 41 32* 39 39  BILITOT 2.0* 1.4* 1.2 1.4* 1.4*  PROT 5.5* 5.5* 5.8* 6.1* 5.6*  ALBUMIN 3.1* 2.9* 3.5 3.3* 2.9*    No results for input(s): LIPASE, AMYLASE in the last 168 hours. No results for input(s): AMMONIA in the last 168 hours.  Coagulation Profile: Recent Labs  Lab 01/18/21 0751  INR 1.3*    Cardiac Enzymes: No results for input(s): CKTOTAL, CKMB, CKMBINDEX, TROPONINI in the last 168 hours. BNP (last 3 results) No results for input(s): PROBNP in the last 8760 hours. HbA1C: No results for input(s): HGBA1C in the last 72 hours. CBG: Recent Labs  Lab 01/17/21 0717 01/17/21 1146 01/17/21 2026 01/18/21 0816 01/18/21 1122  GLUCAP 89 103* 101* 86 95    Lipid Profile: No results for input(s): CHOL, HDL, LDLCALC, TRIG, CHOLHDL, LDLDIRECT in the last 72 hours.  Thyroid Function Tests: No results for input(s): TSH, T4TOTAL, FREET4, T3FREE, THYROIDAB in the last 72 hours. Anemia Panel: No results for input(s): VITAMINB12, FOLATE, FERRITIN, TIBC, IRON, RETICCTPCT in the last 72 hours. Sepsis Labs: No results for input(s): PROCALCITON,  LATICACIDVEN in the last 168 hours.  Recent Results (from the past 240 hour(s))  Resp Panel by RT-PCR (Flu A&B, Covid) Nasopharyngeal Swab     Status: None   Collection Time: 01/12/21  5:20 PM   Specimen: Nasopharyngeal Swab; Nasopharyngeal(NP) swabs in vial transport medium  Result Value Ref Range Status   SARS Coronavirus 2 by RT PCR NEGATIVE NEGATIVE Final    Comment: (NOTE) SARS-CoV-2 target nucleic acids are NOT DETECTED.  The SARS-CoV-2 RNA is generally detectable in upper respiratory specimens during the acute phase of infection. The lowest concentration of SARS-CoV-2 viral copies this assay can detect is 138 copies/mL. A negative result does not preclude SARS-Cov-2 infection and should not be used as the sole basis for treatment or other patient management decisions. A negative result may occur with  improper specimen collection/handling, submission of specimen other than nasopharyngeal swab, presence of viral mutation(s) within the areas targeted by this  assay, and inadequate number of viral copies(<138 copies/mL). A negative result must be combined with clinical observations, patient history, and epidemiological information. The expected result is Negative.  Fact Sheet for Patients:  BloggerCourse.com  Fact Sheet for Healthcare Providers:  SeriousBroker.it  This test is no t yet approved or cleared by the [place] FDA and  has been authorized for detection and/or diagnosis of SARS-CoV-2 by FDA under an Emergency Use Authorization (EUA). This EUA will remain  in effect (meaning this test can be used) for the duration of the COVID-19 declaration under Section 564(b)(1) of the Act, 21 U.S.C.section 360bbb-3(b)(1), unless the authorization is terminated  or revoked sooner.       Influenza A by PCR NEGATIVE NEGATIVE Final   Influenza B by PCR NEGATIVE NEGATIVE Final    Comment: (NOTE) The Xpert Xpress  SARS-CoV-2/FLU/RSV plus assay is intended as an aid in the diagnosis of influenza from Nasopharyngeal swab specimens and should not be used as a sole basis for treatment. Nasal washings and aspirates are unacceptable for Xpert Xpress SARS-CoV-2/FLU/RSV testing.  Fact Sheet for Patients: BloggerCourse.com  Fact Sheet for Healthcare Providers: SeriousBroker.it  This test is not yet approved or cleared by the [place] FDA and has been authorized for detection and/or diagnosis of SARS-CoV-2 by FDA under an Emergency Use Authorization (EUA). This EUA will remain in effect (meaning this test can be used) for the duration of the COVID-19 declaration under Section 564(b)(1) of the Act, 21 U.S.C. section 360bbb-3(b)(1), unless the authorization is terminated or revoked.  Performed at [facility], [address], [place], [place] [address]   Peritoneal fluid culture w Gram Stain     Status: None   Collection Time: 01/12/21  7:10 PM   Specimen: Peritoneal Washings; Peritoneal Fluid  Result Value Ref Range Status   Specimen Description   Final    PERITONEAL FLUID Performed at [facility], [address] [place], [place] [address]    Special Requests   Final    NONE Performed at [facility], [address] [place], [place] [address]    Gram Stain   Final    FEW SQUAMOUS EPITHELIAL CELLS PRESENT MODERATE WBC SEEN NO ORGANISMS SEEN    Culture   Final    NO GROWTH 3 DAYS Performed at [facility], [address] [place], [place] [address]    Report Status 01/16/2021 FINAL  Final  Urine Culture     Status: Abnormal   Collection Time: 01/13/21  8:28 AM   Specimen: Urine, Clean Catch  Result Value Ref Range Status   Specimen Description   Final    URINE, CLEAN CATCH Performed at [facility], [address], [place], [place] [address]    Special Requests   Final    NONE Performed  at [facility], [address] [place], [place] [address]    Culture >=100,000 COLONIES/mL KLEBSIELLA PNEUMONIAE (A)  Final   Report Status 01/15/2021 FINAL  Final   Organism ID, Bacteria KLEBSIELLA PNEUMONIAE (A)  Final      Susceptibility   Klebsiella pneumoniae - MIC*    AMPICILLIN >=32 RESISTANT Resistant     CEFAZOLIN <=4 SENSITIVE Sensitive     CEFEPIME <=0.12 SENSITIVE Sensitive     CEFTRIAXONE <=0.25 SENSITIVE Sensitive     CIPROFLOXACIN <=0.25 SENSITIVE Sensitive     GENTAMICIN <=1 SENSITIVE Sensitive     IMIPENEM <=0.25 SENSITIVE Sensitive     NITROFURANTOIN  64 INTERMEDIATE Intermediate     TRIMETH/SULFA <=20 SENSITIVE Sensitive     AMPICILLIN/SULBACTAM 4 SENSITIVE Sensitive     PIP/TAZO <=4 SENSITIVE Sensitive     * >=100,000 COLONIES/mL KLEBSIELLA PNEUMONIAE  Body fluid culture w Gram Stain     Status: None (Preliminary result)   Collection Time: 01/19/21  4:00 PM   Specimen: PATH Cytology Peritoneal fluid  Result Value Ref Range Status   Specimen Description   Final    PERITONEAL Performed at [facility], [address] [place], [place] [address]    Special Requests   Final    NONE Performed at [facility], [address] [place], [place] [address]    Gram Stain   Final    NO ORGANISMS SEEN SQUAMOUS EPITHELIAL CELLS PRESENT FEW WBC PRESENT, PREDOMINANTLY MONONUCLEAR NO ORGANISMS SEEN BACTERIA    Culture   Final    NO GROWTH 2 DAYS Performed at [facility], [address] [place], [place] [address]    Report Status PENDING  Incomplete     Radiology Studies: No results found.   Scheduled Meds:  Chlorhexidine Gluconate Cloth  6 each Topical Daily   folic acid  1 mg Oral Daily   midodrine  10 mg Oral TID WC   nystatin   Topical TID   spironolactone  100 mg Oral Daily   vitamin B-12  500 mcg Oral Daily   Vitamin D (Ergocalciferol)  50,000 Units Oral Q7 days   Continuous Infusions:  albumin human       LOS:  9 days   Time spent= 35 mins  Azucena Fallen, DO [facility]  If 7PM-7AM, please contact night-coverage  01/22/2021, 7:27 AM

## 2021-01-22 NOTE — Care Management Important Message (Signed)
Important Message  Patient Details  Name: LAKETHA LEOPARD MRN: [id number] Date of Birth: 08-25-1950   Medicare Important Message Given:  Yes     Olegario Messier A Nanetta Wiegman 01/22/2021, 10:54 AM

## 2021-01-23 DIAGNOSIS — R188 Other ascites: Secondary | ICD-10-CM | POA: Diagnosis not present

## 2021-01-23 LAB — COMPREHENSIVE METABOLIC PANEL
ALT: 13 U/L (ref 0–44)
AST: 37 U/L (ref 15–41)
Albumin: 3.3 g/dL — ABNORMAL LOW (ref 3.5–5.0)
Alkaline Phosphatase: 44 U/L (ref 38–126)
Anion gap: 8 (ref 5–15)
BUN: 19 mg/dL (ref 8–23)
CO2: 32 mmol/L (ref 22–32)
Calcium: 9.1 mg/dL (ref 8.9–10.3)
Chloride: 94 mmol/L — ABNORMAL LOW (ref 98–111)
Creatinine, Ser: 1.16 mg/dL — ABNORMAL HIGH (ref 0.44–1.00)
GFR, Estimated: 51 mL/min — ABNORMAL LOW (ref >60)
Glucose, Bld: 91 mg/dL (ref 70–99)
Potassium: 4.5 mmol/L (ref 3.5–5.1)
Sodium: 134 mmol/L — ABNORMAL LOW (ref 135–145)
Total Bilirubin: 1.4 mg/dL — ABNORMAL HIGH (ref 0.3–1.2)
Total Protein: 6.2 g/dL — ABNORMAL LOW (ref 6.5–8.1)

## 2021-01-23 LAB — BODY FLUID CULTURE W GRAM STAIN: Culture: NO GROWTH

## 2021-01-23 LAB — CBC
HCT: 40.3 % (ref 36.0–46.0)
Hemoglobin: 13.1 g/dL (ref 12.0–15.0)
MCH: 25.7 pg — ABNORMAL LOW (ref 26.0–34.0)
MCHC: 32.5 g/dL (ref 30.0–36.0)
MCV: 79.2 fL — ABNORMAL LOW (ref 80.0–100.0)
Platelets: 59 10*3/uL — ABNORMAL LOW (ref 150–400)
RBC: 5.09 MIL/uL (ref 3.87–5.11)
RDW: 17.4 % — ABNORMAL HIGH (ref 11.5–15.5)
WBC: 3.2 10*3/uL — ABNORMAL LOW (ref 4.0–10.5)
nRBC: 0 % (ref 0.0–0.2)

## 2021-01-23 LAB — GLUCOSE, CAPILLARY: Glucose-Capillary: 89 mg/dL (ref 70–99)

## 2021-01-23 MED ORDER — FUROSEMIDE 40 MG PO TABS
40.0000 mg | ORAL_TABLET | Freq: Two times a day (BID) | ORAL | Status: DC
  Administered 2021-01-23 – 2021-01-26 (×7): 40 mg via ORAL
  Filled 2021-01-23 (×7): qty 1

## 2021-01-23 NOTE — Plan of Care (Signed)

## 2021-01-23 NOTE — Progress Notes (Signed)
Physical Therapy Treatment Patient Details Name: Joan Roman MRN: [id number] DOB: 1951/01/16 Today's Date: 01/23/2021    History of Present Illness 70 year old with no known past medical history admitted to the hospital for progressive bilateral lower extremity swelling for about 1 and half months, abdominal swelling found to have new onset of decompensated cirrhosis.  During hospitalization underwent large-volume paracentesis on 8/17, 8/19 complicated by soft blood pressure and copious loose stools.    PT Comments    Pt seen for PT tx with session focused on d/c planning with pt reporting she is going to her brother's house, noting he is not home 24/7 but his wife is, although she is unable to provide physical assistance. Pt completes bed mobility with supervision with cuing for log rolling technique & completes sit>stand with supervision with cuing for hand placement for safety. Pt requires CGA<>supervision for gait & negotiates 6 steps with R ascending rail with min assist with step to pattern. Pt does require seated rest breaks between mobility & endorses slight lightheadedness/dizziness with positional changes that quickly subsides. This PT feels much better about pt d/c home with HHPT after she was able to negotiate stairs with min assist. Will continue to follow pt acutely to progress endurance, strength & independence with gait with LRAD & stair negotiation.     Follow Up Recommendations  Home health PT;Supervision for mobility/OOB     Equipment Recommendations  Rolling walker with 5" wheels;3in1 (PT)    Recommendations for Other Services       Precautions / Restrictions Precautions Precautions: Fall Restrictions Weight Bearing Restrictions: No    Mobility  Bed Mobility Overal bed mobility: Needs Assistance Bed Mobility: Rolling;Sidelying to Sit Rolling: Supervision Sidelying to sit: Supervision       General bed mobility comments: bed flat to simulate regular bed  at home, cuing for log rolling technique, supervision overall with extra time to complete movement    Transfers Overall transfer level: Needs assistance Equipment used: Rolling walker (2 wheeled) Transfers: Sit to/from Stand Sit to Stand: Supervision         General transfer comment: cuing for proper/safe hand placement  Ambulation/Gait Ambulation/Gait assistance: Min guard;Supervision Gait Distance (Feet): 40 Feet Assistive device: Rolling walker (2 wheeled) Gait Pattern/deviations: Decreased step length - right;Decreased step length - left;Decreased stride length Gait velocity: decreased       Stairs Stairs: Yes Stairs assistance: Min assist Stair Management: One rail Right;Step to pattern;Sideways Number of Stairs: 6     Wheelchair Mobility    Modified Rankin (Stroke Patients Only)       Balance Overall balance assessment: Needs assistance Sitting-balance support: No upper extremity supported Sitting balance-Leahy Scale: Good     Standing balance support: Bilateral upper extremity supported Standing balance-Leahy Scale: Fair                              Cognition Arousal/Alertness: Awake/alert Behavior During Therapy: WFL for tasks assessed/performed Overall Cognitive Status: Within Functional Limits for tasks assessed                                        Exercises      General Comments General comments (skin integrity, edema, etc.): Pt c/o mild SOB with activity & SpO2 >90% on room air. Pt also c/o fatigue & requires seated rest break after gait prior  to stair negotiation.      Pertinent Vitals/Pain Pain Assessment: No/denies pain    Home Living                      Prior Function            PT Goals (current goals can now be found in the care plan section) Acute Rehab PT Goals Patient Stated Goal: go to my brother's in [place] PT Goal Formulation: With patient Time For Goal Achievement:  02/03/21 Potential to Achieve Goals: Good Progress towards PT goals: Progressing toward goals    Frequency    Min 2X/week      PT Plan Current plan remains appropriate    Co-evaluation              AM-PAC PT "6 Clicks" Mobility   Outcome Measure  Help needed turning from your back to your side while in a flat bed without using bedrails?: A Little Help needed moving from lying on your back to sitting on the side of a flat bed without using bedrails?: A Little Help needed moving to and from a bed to a chair (including a wheelchair)?: A Little Help needed standing up from a chair using your arms (e.g., wheelchair or bedside chair)?: A Little Help needed to walk in hospital room?: A Little Help needed climbing 3-5 steps with a railing? : A Little 6 Click Score: 18    End of Session Equipment Utilized During Treatment: Gait belt Activity Tolerance: Patient tolerated treatment well Patient left: in chair;with chair alarm set;with call bell/phone within reach Nurse Communication: Mobility status PT Visit Diagnosis: Muscle weakness (generalized) (M62.81);Difficulty in walking, not elsewhere classified (R26.2);Unsteadiness on feet (R26.81)     Time: 6734-1937 PT Time Calculation (min) (ACUTE ONLY): 28 min  Charges:  $Gait Training: 8-22 mins $Therapeutic Activity: 8-22 mins                     Aleda Grana, PT, DPT 01/23/21, 2:34 PM    Sandi Mariscal 01/23/2021, 2:32 PM

## 2021-01-23 NOTE — Progress Notes (Signed)
PROGRESS NOTE    PIERCE BIAGINI  QBH:[id number] DOB: 05-Oct-1950 DOA: 01/12/2021 PCP: Pcp, No   Brief Narrative:  70 year old with no known past medical history admitted to the hospital for progressive bilateral lower extremity swelling for about 1 and half months, abdominal swelling found to have new onset of decompensated cirrhosis.  During hospitalization underwent large-volume paracentesis on 8/17, 8/19 complicated by soft blood pressure.  Received IV albumin and midodrine.  GI team was consulted, Lasix drip was discontinued and and started oral Aldactone with plans to slowly start lasix.   Assessment & Plan:   Principal Problem:   Abdominal ascites Active Problems:   Obesity, Class III, BMI 40-49.9 (morbid obesity) (HCC)   Pleural effusion   Thrombocytopenia (HCC)   Liver cirrhosis (HCC)   Ascites of liver   Decompensated liver cirrhosis with ascites, resolving Thrombocytopenia, hypoalbuminemia - Unknown etiology - acute hepatitis panel negative, TSH normal, HIV nonreactive.   - Autoimmune work-up ongoing with GI.  Echocardiogram shows EF 65 to 70%.  Lipid panel normal, A1c 4.4. Status post paracentesis 8/17, 8/19, 8/23, 8/26 - Repeat paracentesis 8/26 - Continues on midodrine 10 mg 3 times daily. - Currently on Aldactone.  - Initiate trial of low dose po lasix 8/26 -tolerated well, increase to 40 mg p.o. twice daily - Propranolol held due to borderline bradycardia at rest - Plan for outpatient follow-up with IR for TIPS procedure, once patient's volume status is stabilized will likely discharge home with close outpatient follow-up  Diarrhea, improving -Improving, unlikely infectious, patient transiently on antibiotics for UTI as below but labs not consistent with C. Difficile; no indication for stool culture/sample  Hypokalemia -Continue to follow, currently WNL  Trace Right sided pleural effusion - Likely from underlying liver cirrhosis  Vitamin D deficiency - Oral  supplements  Folate deficiency Borderline vitamin B12 deficiency -Continue vitamin supplement  Urinary tract infection with Klebsiella -Completed course with Keflex previously  DVT prophylaxis: Place TED hose Start: 01/12/21 1842 Code Status: Full Family Communication:  Patient to update  Status is: Inpatient  Remains inpatient appropriate because:Inpatient level of care appropriate due to severity of illness  Dispo: The patient is from: Home              Anticipated d/c is to: Home              Patient currently is not medically stable to d/c.  Still undergoing fluid management, may require repeat paracentesis in the next 24 to 48 hours  Subjective: Diarrhea resolving denies nausea vomiting constipation headache fevers chills or chest pain; abdominal pain/distention improving  Examination: Constitutional: Not in acute distress Respiratory: Clear to auscultation bilaterally Cardiovascular: Normal sinus rhythm, no rubs Abdomen: Soft nontender nondistended Musculoskeletal: 1+ bilateral lower extremity pitting edema Skin: No rashes seen Neurologic: CN 2-12 grossly intact.  And nonfocal Psychiatric: Normal judgment and insight. Alert and oriented x 3. Normal mood.  Objective: Vitals:   01/22/21 1647 01/22/21 2027 01/22/21 2334 01/23/21 0413  BP: 109/76 (!) 86/71 (!) 88/58 100/66  Pulse:  64 62 62  Resp:  16 16 16   Temp:  98.3 F (36.8 C) 98.3 F (36.8 C) 97.7 F (36.5 C)  TempSrc:  Oral Oral Oral  SpO2:  98% 96% 97%  Weight:      Height:        Intake/Output Summary (Last 24 hours) at 01/23/2021 0732 Last data filed at 01/23/2021 0423 Gross per 24 hour  Intake 240 ml  Output 800 ml  Net -560 ml    Filed Weights   01/12/21 1353 01/12/21 2111  Weight: 111.1 kg 111.6 kg     Data Reviewed:   CBC: Recent Labs  Lab 01/17/21 0542 01/20/21 0527 01/21/21 0456 01/22/21 0400 01/23/21 0541  WBC 3.4* 3.1* 3.4* 3.6* 3.2*  HGB 12.5 11.7* 12.8 12.2 13.1  HCT 38.3  36.2 39.0 37.8 40.3  MCV 80.3 80.6 81.1 79.2* 79.2*  PLT 66* 62* 67* 64* 59*    Basic Metabolic Panel: Recent Labs  Lab 01/17/21 0542 01/18/21 0555 01/19/21 0439 01/20/21 0527 01/21/21 0456 01/22/21 0400 01/23/21 0541  NA 136 138 137 134* 132* 133* 134*  K 3.0* 3.6 3.4* 3.5 4.2 3.7 4.5  CL 90* 95* 93* 92* 92* 95* 94*  CO2 33* 35* 34* 33* 33* 33* 32  GLUCOSE 91 95 112* 114* 98 85 91  BUN 15 16 18 18 18 20 19   CREATININE 0.90 1.02* 1.03* 1.10* 1.13* 1.05* 1.16*  CALCIUM 8.0* 8.5* 8.6* 8.9 9.1 8.5* 9.1  MG 1.6* 1.8 1.9 1.9 1.9 1.8  --   PHOS 2.5  --   --   --   --   --   --     GFR: Estimated Creatinine Clearance: 56.8 mL/min (A) (by C-G formula based on SCr of 1.16 mg/dL (H)). Liver Function Tests: Recent Labs  Lab 01/19/21 0439 01/20/21 0527 01/21/21 0456 01/22/21 0400 01/23/21 0541  AST 26 25 29 29  37  ALT 8 10 11 12 13   ALKPHOS 41 32* 39 39 44  BILITOT 1.4* 1.2 1.4* 1.4* 1.4*  PROT 5.5* 5.8* 6.1* 5.6* 6.2*  ALBUMIN 2.9* 3.5 3.3* 2.9* 3.3*    No results for input(s): LIPASE, AMYLASE in the last 168 hours. No results for input(s): AMMONIA in the last 168 hours.  Coagulation Profile: Recent Labs  Lab 01/18/21 0751  INR 1.3*    Cardiac Enzymes: No results for input(s): CKTOTAL, CKMB, CKMBINDEX, TROPONINI in the last 168 hours. BNP (last 3 results) No results for input(s): PROBNP in the last 8760 hours. HbA1C: No results for input(s): HGBA1C in the last 72 hours. CBG: Recent Labs  Lab 01/17/21 1146 01/17/21 2026 01/18/21 0816 01/18/21 1122 01/23/21 0726  GLUCAP 103* 101* 86 95 89    Lipid Profile: No results for input(s): CHOL, HDL, LDLCALC, TRIG, CHOLHDL, LDLDIRECT in the last 72 hours.  Thyroid Function Tests: No results for input(s): TSH, T4TOTAL, FREET4, T3FREE, THYROIDAB in the last 72 hours. Anemia Panel: No results for input(s): VITAMINB12, FOLATE, FERRITIN, TIBC, IRON, RETICCTPCT in the last 72 hours. Sepsis Labs: No results for  input(s): PROCALCITON, LATICACIDVEN in the last 168 hours.  Recent Results (from the past 240 hour(s))  Urine Culture     Status: Abnormal   Collection Time: 01/13/21  8:28 AM   Specimen: Urine, Clean Catch  Result Value Ref Range Status   Specimen Description   Final    URINE, CLEAN CATCH Performed at [facility], [address] [place], [facility] [address]    Special Requests   Final    NONE Performed at [facility], [address] [place], [facility] [address]    Culture >=100,000 COLONIES/mL KLEBSIELLA PNEUMONIAE (A)  Final   Report Status 01/15/2021 FINAL  Final   Organism ID, Bacteria KLEBSIELLA PNEUMONIAE (A)  Final      Susceptibility   Klebsiella pneumoniae - MIC*    AMPICILLIN >=32 RESISTANT Resistant     CEFAZOLIN <=4 SENSITIVE Sensitive  CEFEPIME <=0.12 SENSITIVE Sensitive     CEFTRIAXONE <=0.25 SENSITIVE Sensitive     CIPROFLOXACIN <=0.25 SENSITIVE Sensitive     GENTAMICIN <=1 SENSITIVE Sensitive     IMIPENEM <=0.25 SENSITIVE Sensitive     NITROFURANTOIN 64 INTERMEDIATE Intermediate     TRIMETH/SULFA <=20 SENSITIVE Sensitive     AMPICILLIN/SULBACTAM 4 SENSITIVE Sensitive     PIP/TAZO <=4 SENSITIVE Sensitive     * >=100,000 COLONIES/mL KLEBSIELLA PNEUMONIAE  Body fluid culture w Gram Stain     Status: None (Preliminary result)   Collection Time: 01/19/21  4:00 PM   Specimen: PATH Cytology Peritoneal fluid  Result Value Ref Range Status   Specimen Description   Final    PERITONEAL Performed at [facility], [address], [place], [place] [address]    Special Requests   Final    NONE Performed at [facility], [address] [place], [place] [address]    Gram Stain   Final    NO ORGANISMS SEEN SQUAMOUS EPITHELIAL CELLS PRESENT FEW WBC PRESENT, PREDOMINANTLY MONONUCLEAR NO ORGANISMS SEEN BACTERIA    Culture   Final    NO GROWTH 3 DAYS Performed at [facility], [address] [place], [place] [address]     Report Status PENDING  Incomplete     Radiology Studies: No results found.   Scheduled Meds:  Chlorhexidine Gluconate Cloth  6 each Topical Daily   folic acid  1 mg Oral Daily   furosemide  20 mg Oral BID   midodrine  10 mg Oral TID WC   nystatin   Topical TID   spironolactone  100 mg Oral Daily   vitamin B-12  500 mcg Oral Daily   Vitamin D (Ergocalciferol)  50,000 Units Oral Q7 days   Continuous Infusions:  albumin human       LOS: 10 days   Time spent= 35 mins  Azucena Fallen, DO [facility]  If 7PM-7AM, please contact night-coverage  01/23/2021, 7:32 AM

## 2021-01-23 NOTE — TOC Progression Note (Signed)
Transition of Care [facility]) - Progression Note    Patient Details  Name: MONTOYA WATKIN MRN: [id number] Date of Birth: 1950-08-05  Transition of Care [facility]) CM/SW Contact  Allayne Butcher, RN Phone Number: 01/23/2021, 12:47 PM  Clinical Narrative:    Patient is not medically stable for discharge over the weekend.  RNCM has already re scheduled PCP appointment for Friday 9/2.   Expected Discharge Plan: Home w Home Health Services Barriers to Discharge: Continued Medical Work up  Expected Discharge Plan and Services Expected Discharge Plan: Home w Home Health Services   Discharge Planning Services: CM Consult Post Acute Care Choice: Home Health Living arrangements for the past 2 months: Single Family Home                 DME Arranged: 3-N-1, Walker rolling DME Agency: [employer] Date DME Agency Contacted: 01/21/21 Time DME Agency Contacted: [phone] Representative spoke with at DME Agency: Bjorn Loser HH Arranged: RN, PT, OT, Nurse's Aide HH Agency: [facility] Date HH Agency Contacted: 01/21/21 Time HH Agency Contacted: 1423 Representative spoke with at [facility] Agency: Barbara Cower   Social Determinants of Health (SDOH) Interventions    Readmission Risk Interventions No flowsheet data found.

## 2021-01-24 DIAGNOSIS — R188 Other ascites: Secondary | ICD-10-CM | POA: Diagnosis not present

## 2021-01-24 DIAGNOSIS — J90 Pleural effusion, not elsewhere classified: Secondary | ICD-10-CM | POA: Diagnosis not present

## 2021-01-24 DIAGNOSIS — K7460 Unspecified cirrhosis of liver: Secondary | ICD-10-CM | POA: Diagnosis not present

## 2021-01-24 LAB — COMPREHENSIVE METABOLIC PANEL
ALT: 14 U/L (ref 0–44)
AST: 38 U/L (ref 15–41)
Albumin: 3.1 g/dL — ABNORMAL LOW (ref 3.5–5.0)
Alkaline Phosphatase: 41 U/L (ref 38–126)
Anion gap: 7 (ref 5–15)
BUN: 21 mg/dL (ref 8–23)
CO2: 32 mmol/L (ref 22–32)
Calcium: 8.9 mg/dL (ref 8.9–10.3)
Chloride: 97 mmol/L — ABNORMAL LOW (ref 98–111)
Creatinine, Ser: 1.27 mg/dL — ABNORMAL HIGH (ref 0.44–1.00)
GFR, Estimated: 46 mL/min — ABNORMAL LOW (ref >60)
Glucose, Bld: 94 mg/dL (ref 70–99)
Potassium: 4.2 mmol/L (ref 3.5–5.1)
Sodium: 136 mmol/L (ref 135–145)
Total Bilirubin: 1.2 mg/dL (ref 0.3–1.2)
Total Protein: 5.7 g/dL — ABNORMAL LOW (ref 6.5–8.1)

## 2021-01-24 LAB — CBC
HCT: 39.6 % (ref 36.0–46.0)
Hemoglobin: 12.8 g/dL (ref 12.0–15.0)
MCH: 26 pg (ref 26.0–34.0)
MCHC: 32.3 g/dL (ref 30.0–36.0)
MCV: 80.3 fL (ref 80.0–100.0)
Platelets: 62 10*3/uL — ABNORMAL LOW (ref 150–400)
RBC: 4.93 MIL/uL (ref 3.87–5.11)
RDW: 17.6 % — ABNORMAL HIGH (ref 11.5–15.5)
WBC: 3.5 10*3/uL — ABNORMAL LOW (ref 4.0–10.5)
nRBC: 0 % (ref 0.0–0.2)

## 2021-01-24 NOTE — Progress Notes (Signed)
PROGRESS NOTE    JESSELYN RASK  SAY:[id number] DOB: [birth year] DOA: 01/12/2021 PCP: Pcp, No   Brief Narrative:  70 year old with no known past medical history admitted to the hospital for progressive bilateral lower extremity swelling for about 1 and half months, abdominal swelling found to have new onset of decompensated cirrhosis.  During hospitalization underwent large-volume paracentesis on 8/17, 8/19 complicated by soft blood pressure.  Received IV albumin and midodrine.  GI team was consulted, Lasix drip was discontinued and and started oral Aldactone, slowly titrating Lasix up, awaiting further discussion for TIPS procedure.  Assessment & Plan:   Decompensated liver cirrhosis with ascites, resolving Thrombocytopenia, hypoalbuminemia - Unknown etiology - acute hepatitis panel negative, TSH normal, HIV nonreactive.   - Autoimmune work-up ongoing with GI.  Echocardiogram shows EF 65 to 70%.  Lipid panel normal, A1c 4.4. Status post paracentesis 8/17, 8/19, 8/23, 8/26 - Repeat paracentesis in the next 24-48h - Continues on midodrine 10 mg 3 times daily. - Currently on Aldactone - continue to titrate lasix as tolerated - 40mg  PO bid currently - Propranolol held due to borderline bradycardia at rest - Plan for outpatient follow-up with IR for TIPS procedure, once patient's volume status is stabilized will likely discharge home with close outpatient follow-up - if patient's volume status continues to be somewhat labile will likely need to discuss possible TIPS procedure in-house prior to discharge.  Diarrhea, improving -Improving, unlikely infectious, patient transiently on antibiotics for UTI as below but labs not consistent with C. Difficile; no indication for stool culture/sample  Hypokalemia -Continue to follow, currently WNL  Trace Right sided pleural effusion - Likely from underlying liver cirrhosis  Vitamin D deficiency - Oral supplements  Folate deficiency Borderline  vitamin B12 deficiency -Continue vitamin supplement  Urinary tract infection with Klebsiella -Completed course with Keflex previously  DVT prophylaxis: Place TED hose Start: 01/12/21 1842 Code Status: Full Family Communication:  Patient to update  Status is: Inpatient  Remains inpatient appropriate because:Inpatient level of care appropriate due to severity of illness  Dispo: The patient is from: Home              Anticipated d/c is to: Home              Patient currently is not medically stable to d/c.  Still undergoing fluid management, may require repeat paracentesis in the next 24 to 48 hours  Subjective: Diarrhea resolving denies nausea vomiting constipation headache fevers chills or chest pain; abdominal pain/distention improving  Examination: Constitutional: Not in acute distress Respiratory: Clear to auscultation bilaterally Cardiovascular: Normal sinus rhythm, no rubs Abdomen: Soft nontender nondistended Musculoskeletal: 1+ bilateral lower extremity pitting edema Skin: No rashes seen Neurologic: CN 2-12 grossly intact.  And nonfocal Psychiatric: Normal judgment and insight. Alert and oriented x 3. Normal mood.  Objective: Vitals:   01/23/21 1714 01/23/21 2103 01/24/21 0027 01/24/21 0522  BP: 128/72 112/64 108/69 (!) 99/57  Pulse: (!) 54 61 (!) 57 (!) 56  Resp: 16 16 16 16   Temp: 98 F (36.7 C) 97.7 F (36.5 C) 98.4 F (36.9 C) 98.2 F (36.8 C)  TempSrc:   Oral Oral  SpO2: 98% 95% 97% 96%  Weight:      Height:        Intake/Output Summary (Last 24 hours) at 01/24/2021 0728 Last data filed at 01/24/2021 0534 Gross per 24 hour  Intake 360 ml  Output 400 ml  Net -40 ml    Filed Weights   01/12/21  1353 01/12/21 2111  Weight: 111.1 kg 111.6 kg     Data Reviewed:   CBC: Recent Labs  Lab 01/20/21 0527 01/21/21 0456 01/22/21 0400 01/23/21 0541 01/24/21 0504  WBC 3.1* 3.4* 3.6* 3.2* 3.5*  HGB 11.7* 12.8 12.2 13.1 12.8  HCT 36.2 39.0 37.8 40.3  39.6  MCV 80.6 81.1 79.2* 79.2* 80.3  PLT 62* 67* 64* 59* 62*    Basic Metabolic Panel: Recent Labs  Lab 01/18/21 0555 01/19/21 0439 01/20/21 0527 01/21/21 0456 01/22/21 0400 01/23/21 0541 01/24/21 0504  NA 138 137 134* 132* 133* 134* 136  K 3.6 3.4* 3.5 4.2 3.7 4.5 4.2  CL 95* 93* 92* 92* 95* 94* 97*  CO2 35* 34* 33* 33* 33* 32 32  GLUCOSE 95 112* 114* 98 85 91 94  BUN 16 18 18 18 20 19 21   CREATININE 1.02* 1.03* 1.10* 1.13* 1.05* 1.16* 1.27*  CALCIUM 8.5* 8.6* 8.9 9.1 8.5* 9.1 8.9  MG 1.8 1.9 1.9 1.9 1.8  --   --     GFR: Estimated Creatinine Clearance: 51.9 mL/min (A) (by C-G formula based on SCr of 1.27 mg/dL (H)). Liver Function Tests: Recent Labs  Lab 01/20/21 0527 01/21/21 0456 01/22/21 0400 01/23/21 0541 01/24/21 0504  AST 25 29 29  37 38  ALT 10 11 12 13 14   ALKPHOS 32* 39 39 44 41  BILITOT 1.2 1.4* 1.4* 1.4* 1.2  PROT 5.8* 6.1* 5.6* 6.2* 5.7*  ALBUMIN 3.5 3.3* 2.9* 3.3* 3.1*    No results for input(s): LIPASE, AMYLASE in the last 168 hours. No results for input(s): AMMONIA in the last 168 hours.  Coagulation Profile: Recent Labs  Lab 01/18/21 0751  INR 1.3*    Cardiac Enzymes: No results for input(s): CKTOTAL, CKMB, CKMBINDEX, TROPONINI in the last 168 hours. BNP (last 3 results) No results for input(s): PROBNP in the last 8760 hours. HbA1C: No results for input(s): HGBA1C in the last 72 hours. CBG: Recent Labs  Lab 01/17/21 1146 01/17/21 2026 01/18/21 0816 01/18/21 1122 01/23/21 0726  GLUCAP 103* 101* 86 95 89    Lipid Profile: No results for input(s): CHOL, HDL, LDLCALC, TRIG, CHOLHDL, LDLDIRECT in the last 72 hours.  Thyroid Function Tests: No results for input(s): TSH, T4TOTAL, FREET4, T3FREE, THYROIDAB in the last 72 hours. Anemia Panel: No results for input(s): VITAMINB12, FOLATE, FERRITIN, TIBC, IRON, RETICCTPCT in the last 72 hours. Sepsis Labs: No results for input(s): PROCALCITON, LATICACIDVEN in the last 168  hours.  Recent Results (from the past 240 hour(s))  Body fluid culture w Gram Stain     Status: None   Collection Time: 01/19/21  4:00 PM   Specimen: PATH Cytology Peritoneal fluid  Result Value Ref Range Status   Specimen Description   Final    PERITONEAL Performed at [facility], [address], [place], [facility] [address]    Special Requests   Final    NONE Performed at [facility], [address] [place], [facility] [address]    Gram Stain   Final    NO ORGANISMS SEEN SQUAMOUS EPITHELIAL CELLS PRESENT FEW WBC PRESENT, PREDOMINANTLY MONONUCLEAR NO ORGANISMS SEEN BACTERIA    Culture   Final    NO GROWTH 3 DAYS Performed at [facility], [address], [place], [facility] [address]    Report Status 01/23/2021 FINAL  Final     Radiology Studies: No results found.   Scheduled Meds:  Chlorhexidine Gluconate Cloth  6 each Topical Daily  folic acid  1 mg Oral Daily   furosemide  40 mg Oral BID   midodrine  10 mg Oral TID WC   nystatin   Topical TID   spironolactone  100 mg Oral Daily   vitamin B-12  500 mcg Oral Daily   Vitamin D (Ergocalciferol)  50,000 Units Oral Q7 days   Continuous Infusions:  albumin human       LOS: 11 days   Time spent= 35 mins  Azucena Fallen, DO [facility]  If 7PM-7AM, please contact night-coverage  01/24/2021, 7:28 AM

## 2021-01-25 ENCOUNTER — Inpatient Hospital Stay: Payer: Medicare Other

## 2021-01-25 DIAGNOSIS — R188 Other ascites: Secondary | ICD-10-CM | POA: Diagnosis not present

## 2021-01-25 LAB — COMPREHENSIVE METABOLIC PANEL
ALT: 14 U/L (ref 0–44)
AST: 37 U/L (ref 15–41)
Albumin: 2.9 g/dL — ABNORMAL LOW (ref 3.5–5.0)
Alkaline Phosphatase: 48 U/L (ref 38–126)
Anion gap: 8 (ref 5–15)
BUN: 21 mg/dL (ref 8–23)
CO2: 30 mmol/L (ref 22–32)
Calcium: 8.9 mg/dL (ref 8.9–10.3)
Chloride: 96 mmol/L — ABNORMAL LOW (ref 98–111)
Creatinine, Ser: 1.34 mg/dL — ABNORMAL HIGH (ref 0.44–1.00)
GFR, Estimated: 43 mL/min — ABNORMAL LOW (ref >60)
Glucose, Bld: 95 mg/dL (ref 70–99)
Potassium: 4.1 mmol/L (ref 3.5–5.1)
Sodium: 134 mmol/L — ABNORMAL LOW (ref 135–145)
Total Bilirubin: 0.8 mg/dL (ref 0.3–1.2)
Total Protein: 5.9 g/dL — ABNORMAL LOW (ref 6.5–8.1)

## 2021-01-25 LAB — CBC
HCT: 37.3 % (ref 36.0–46.0)
Hemoglobin: 12.3 g/dL (ref 12.0–15.0)
MCH: 26.5 pg (ref 26.0–34.0)
MCHC: 33 g/dL (ref 30.0–36.0)
MCV: 80.4 fL (ref 80.0–100.0)
Platelets: 69 10*3/uL — ABNORMAL LOW (ref 150–400)
RBC: 4.64 MIL/uL (ref 3.87–5.11)
RDW: 18 % — ABNORMAL HIGH (ref 11.5–15.5)
WBC: 3.8 10*3/uL — ABNORMAL LOW (ref 4.0–10.5)
nRBC: 0 % (ref 0.0–0.2)

## 2021-01-25 NOTE — Progress Notes (Signed)
Occupational Therapy Treatment Patient Details Name: Joan Roman MRN: [id number] DOB: December 30, 1950 Today's Date: 01/25/2021    History of present illness 70 year old with no known past medical history admitted to the hospital for progressive bilateral lower extremity swelling for about 1 and half months, abdominal swelling found to have new onset of decompensated cirrhosis.  During hospitalization underwent large-volume paracentesis on 8/17, 8/19 complicated by soft blood pressure and copious loose stools.   OT comments  Ms. Hamed continues to present with generalized weakness, reduced endurance, and impaired balance, with large abdominal swelling on R side shifting pt's center of gravity. Pt made good effort today, requesting that therapist supervise for safety but that pt complete all transfers on her own if at all possible. Pt did require considerably increased time and effort for bed mobility, transfers, and dressing, but ultimately able to complete all without physical assistance. Minor LOB during w/in room ambulation. Pt able to self-recover and displays good safety awareness. Pt left in recliner, with pillows used to support R side of abdomen and to assist with displacement of fluids to L, per MD recommendation. Pt has now acquired [facility] and RW for home use. DC plans remain appropriate, with continue to follow POC.   Follow Up Recommendations  Home health OT;Supervision - Intermittent (DC to her brother's home)    Equipment Recommendations  Tub/shower seat    Recommendations for Other Services      Precautions / Restrictions Precautions Precautions: Fall Restrictions Weight Bearing Restrictions: No       Mobility Bed Mobility Overal bed mobility: Needs Assistance Bed Mobility: Supine to Sit     Supine to sit: HOB elevated;Supervision     General bed mobility comments: extra time/effort required, but pt eventually able to complete without any physical assistance     Transfers Overall transfer level: Needs assistance Equipment used: Rolling walker (2 wheeled) Transfers: Sit to/from Stand Sit to Stand: Supervision         General transfer comment: recalled/demonstrated earlier education re: safe hand placement on RW    Balance Overall balance assessment: Needs assistance Sitting-balance support: No upper extremity supported Sitting balance-Leahy Scale: Good     Standing balance support: Bilateral upper extremity supported Standing balance-Leahy Scale: Fair Standing balance comment: pt typically does not use AD, but relied heavily on RW this AM; one instance of LOB, from which pt was able to self-recover                           ADL either performed or assessed with clinical judgement   ADL Overall ADL's : Needs assistance/impaired                 Upper Body Dressing : Supervision/safety;Set up Upper Body Dressing Details (indicate cue type and reason): increased time/effort for changing hospital gown Lower Body Dressing: Modified independent;Bed level;Set up Lower Body Dressing Details (indicate cue type and reason): increased time/effort for donning socks             Functional mobility during ADLs: Min guard;Rolling walker       Vision       Perception     Praxis      Cognition Arousal/Alertness: Awake/alert Behavior During Therapy: WFL for tasks assessed/performed Overall Cognitive Status: Within Functional Limits for tasks assessed  General Comments: talkative, cues to redirect        Exercises Other Exercises Other Exercises: Pt educated in importance of OOB activity, safe use of DME, falls prevention   Shoulder Instructions       General Comments      Pertinent Vitals/ Pain       Pain Score: 3  Pain Location: abdomen Pain Descriptors / Indicators: Discomfort Pain Intervention(s): Limited activity within patient's tolerance;Monitored  during session;Repositioned  Home Living                                          Prior Functioning/Environment              Frequency  Min 2X/week        Progress Toward Goals  OT Goals(current goals can now be found in the care plan section)  Progress towards OT goals: Progressing toward goals  Acute Rehab OT Goals Patient Stated Goal: go to my brother's in [place] OT Goal Formulation: With patient Time For Goal Achievement: 02/02/21 Potential to Achieve Goals: Good  Plan Discharge plan remains appropriate;Frequency remains appropriate    Co-evaluation                 AM-PAC OT "6 Clicks" Daily Activity     Outcome Measure   Help from another person eating meals?: None Help from another person taking care of personal grooming?: A Little Help from another person toileting, which includes using toliet, bedpan, or urinal?: A Little Help from another person bathing (including washing, rinsing, drying)?: A Little Help from another person to put on and taking off regular upper body clothing?: None Help from another person to put on and taking off regular lower body clothing?: A Little 6 Click Score: 20    End of Session Equipment Utilized During Treatment: Rolling walker  OT Visit Diagnosis: Other abnormalities of gait and mobility (R26.89);Muscle weakness (generalized) (M62.81) Pain - Right/Left: Right   Activity Tolerance Patient tolerated treatment well   Patient Left in chair;with call bell/phone within reach;with chair alarm set   Nurse Communication          Time: 1610-9604 OT Time Calculation (min): 27 min  Charges: OT General Charges $OT Visit: 1 Visit OT Treatments $Self Care/Home Management : 23-37 mins  Latina Craver, PhD, MS, OTR/L 01/25/21, 11:20 AM

## 2021-01-25 NOTE — Progress Notes (Signed)
PROGRESS NOTE    ALEXIA DINGER  EHM:[id number] DOB: 12/03/1950 DOA: 01/12/2021 PCP: Pcp, No   Brief Narrative:  70 year old with no known past medical history admitted to the hospital for progressive bilateral lower extremity swelling for about 1 and half months, abdominal swelling found to have new onset of decompensated cirrhosis.  During hospitalization underwent large-volume paracentesis on 8/17, 8/19 complicated by soft blood pressure.  Received IV albumin and midodrine.  GI team was consulted, Lasix drip was discontinued and and started oral Aldactone, slowly titrating Lasix up, awaiting further discussion for TIPS procedure. Awaiting discharge/transfer to [facility] for inpatient TIPS/evaluation and ongoing medical care.  Assessment & Plan:   Decompensated liver cirrhosis with ascites, resolving Thrombocytopenia, hypoalbuminemia - Unknown etiology - acute hepatitis panel negative, TSH normal, HIV non-reactive. 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  - Autoimmune work-up ongoing with GI.   - Status post paracentesis 8/17, 8/19, 8/23, 8/26: Repeat paracentesis in the next 24-48h per IR - Continues on midodrine 10 mg 3 times daily. - Currently on Aldactone - continue to titrate lasix as tolerated - 40mg  PO bid currently - Propranolol held due to borderline bradycardia at rest - Plan for transfer to [facility] for TIPS evaluation and follow up with IR.  Diarrhea, resolved - Improving, unlikely infectious, patient transiently on antibiotics for UTI as below but labs not consistent with C. Difficile; no indication for stool culture/sample  Hypokalemia, resolved - Continue to follow, currently WNL  Trace Right sided pleural effusion - Likely from underlying liver cirrhosis, continue diuretics  Vitamin D deficiency - Oral supplements  Folate deficiency Borderline vitamin B12 deficiency - Continue vitamin supplement  Urinary tract infection with Klebsiella - Completed course with  Keflex previously  DVT prophylaxis: Place TED hose Start: 01/12/21 1842 ongoing thrombocytopenia Code Status: Full Family Communication:  Patient to update  Status is: Inpatient  Remains inpatient appropriate because:Inpatient level of care appropriate due to severity of illness  Dispo: The patient is from: Home              Anticipated d/c is to: Home              Patient currently is not medically stable to d/c.  Still undergoing fluid management, may require repeat paracentesis in the next 24 to 48 hours - Transfer to [place] as soon as bed is available  Subjective: No acute issues or events overnight, somewhat anxious about procedure but otherwise denies nausea vomiting diarrhea constipation headache fevers chills chest pain shortness of breath.  Abdominal distention ongoing but no abdominal pain noted  Examination: Constitutional: Not in acute distress Respiratory: Clear to auscultation bilaterally Cardiovascular: Normal sinus rhythm, no rubs Abdomen: Soft nontender moderately distended; moderate fluid shift noted Musculoskeletal: 1+ bilateral lower extremity pitting edema Skin: No rashes seen Neurologic: CN 2-12 grossly intact.  And nonfocal Psychiatric: Normal judgment and insight. Alert and oriented x 3. Normal mood.  Objective: Vitals:   01/24/21 1229 01/24/21 1729 01/24/21 2026 01/25/21 0335  BP: 107/74 92/64 (!) 106/54 (!) 92/57  Pulse: 62 (!) 56 (!) 52 (!) 56  Resp: 16 16  16   Temp: 97.9 F (36.6 C) 98.2 F (36.8 C) 98.1 F (36.7 C) 97.8 F (36.6 C)  TempSrc:    Oral  SpO2: 96% 95% 97% 97%  Weight:      Height:        Intake/Output Summary (Last 24 hours) at 01/25/2021 0748 Last data filed at 01/24/2021 1903 Gross per 24 hour  Intake 240 ml  Output --  Net 240 ml    Filed Weights   01/12/21 1353 01/12/21 2111  Weight: 111.1 kg 111.6 kg     Data Reviewed:   CBC: Recent Labs  Lab 01/21/21 0456 01/22/21 0400 01/23/21 0541 01/24/21 0504  01/25/21 0429  WBC 3.4* 3.6* 3.2* 3.5* 3.8*  HGB 12.8 12.2 13.1 12.8 12.3  HCT 39.0 37.8 40.3 39.6 37.3  MCV 81.1 79.2* 79.2* 80.3 80.4  PLT 67* 64* 59* 62* 69*    Basic Metabolic Panel: Recent Labs  Lab 01/19/21 0439 01/20/21 0527 01/21/21 0456 01/22/21 0400 01/23/21 0541 01/24/21 0504 01/25/21 0429  NA 137 134* 132* 133* 134* 136 134*  K 3.4* 3.5 4.2 3.7 4.5 4.2 4.1  CL 93* 92* 92* 95* 94* 97* 96*  CO2 34* 33* 33* 33* 32 32 30  GLUCOSE 112* 114* 98 85 91 94 95  BUN 18 18 18 20 19 21 21   CREATININE 1.03* 1.10* 1.13* 1.05* 1.16* 1.27* 1.34*  CALCIUM 8.6* 8.9 9.1 8.5* 9.1 8.9 8.9  MG 1.9 1.9 1.9 1.8  --   --   --     GFR: Estimated Creatinine Clearance: 49.2 mL/min (A) (by C-G formula based on SCr of 1.34 mg/dL (H)). Liver Function Tests: Recent Labs  Lab 01/21/21 0456 01/22/21 0400 01/23/21 0541 01/24/21 0504 01/25/21 0429  AST 29 29 37 38 37  ALT 11 12 13 14 14   ALKPHOS 39 39 44 41 48  BILITOT 1.4* 1.4* 1.4* 1.2 0.8  PROT 6.1* 5.6* 6.2* 5.7* 5.9*  ALBUMIN 3.3* 2.9* 3.3* 3.1* 2.9*    No results for input(s): LIPASE, AMYLASE in the last 168 hours. No results for input(s): AMMONIA in the last 168 hours.  Coagulation Profile: Recent Labs  Lab 01/18/21 0751  INR 1.3*    Cardiac Enzymes: No results for input(s): CKTOTAL, CKMB, CKMBINDEX, TROPONINI in the last 168 hours. BNP (last 3 results) No results for input(s): PROBNP in the last 8760 hours. HbA1C: No results for input(s): HGBA1C in the last 72 hours. CBG: Recent Labs  Lab 01/18/21 0816 01/18/21 1122 01/23/21 0726  GLUCAP 86 95 89    Lipid Profile: No results for input(s): CHOL, HDL, LDLCALC, TRIG, CHOLHDL, LDLDIRECT in the last 72 hours.  Thyroid Function Tests: No results for input(s): TSH, T4TOTAL, FREET4, T3FREE, THYROIDAB in the last 72 hours. Anemia Panel: No results for input(s): VITAMINB12, FOLATE, FERRITIN, TIBC, IRON, RETICCTPCT in the last 72 hours. Sepsis Labs: No results for  input(s): PROCALCITON, LATICACIDVEN in the last 168 hours.  Recent Results (from the past 240 hour(s))  Body fluid culture w Gram Stain     Status: None   Collection Time: 01/19/21  4:00 PM   Specimen: PATH Cytology Peritoneal fluid  Result Value Ref Range Status   Specimen Description   Final    PERITONEAL Performed at [facility], [address], [place], [address] [place]    Special Requests   Final    NONE Performed at [facility], [address] [place], [address] [place]    Gram Stain   Final    NO ORGANISMS SEEN SQUAMOUS EPITHELIAL CELLS PRESENT FEW WBC PRESENT, PREDOMINANTLY MONONUCLEAR NO ORGANISMS SEEN BACTERIA    Culture   Final    NO GROWTH 3 DAYS Performed at [facility], [address] [place], [address] [place]    Report Status 01/23/2021 FINAL  Final     Radiology Studies: No results  found.   Scheduled Meds:  folic acid  1 mg Oral Daily   furosemide  40 mg Oral BID   midodrine  10 mg Oral TID WC   nystatin   Topical TID   spironolactone  100 mg Oral Daily   vitamin B-12  500 mcg Oral Daily   Vitamin D (Ergocalciferol)  50,000 Units Oral Q7 days   Continuous Infusions:  albumin human       LOS: 12 days   Time spent= 35 mins  Azucena Fallen, DO [facility]  If 7PM-7AM, please contact night-coverage  01/25/2021, 7:48 AM

## 2021-01-25 NOTE — Progress Notes (Signed)
PT Cancellation Note  Patient Details Name: Joan Roman MRN: [id number] DOB: March 24, 1951   Cancelled Treatment:    Reason Eval/Treat Not Completed: Other (comment)  Pt returned from [place], She declined session at this time stating she did not feel up to it at this time.  Stated she anticipated transfer to [facility] in the next day or so.  Unclear from chart if this is the plan.  Will continue as appropriate at a later time/date.   Danielle Dess 01/25/2021, 4:05 PM

## 2021-01-25 NOTE — Consult Note (Addendum)
Chief Complaint: Patient was seen in consultation today for portal hypertension  Referring Physician(s): Carma Leaven, DO  Patient Status: [facility] - In-pt  History of Present Illness: Joan Roman is a 70 y.o. female currently admitted to [facility] with newly diagnosed decompensated cirrhosis of uncertain etiology.  She has associated refractory ascites and since 01/13/21 has required 4 paracenteses which average 5 L yield.  She is currently receiving a midodrine infusion, aldactone, and lasix IV as needed.  Initially, IR was consulted last week to discuss possibility of TIPS creation in the future, however the patient is currently unable to be discharged due to fluid imbalances and rapidly recurring ascites.  Additionally, her renal functioning is worsening with current Cr of 1.34, up from 1.0 baseline.   She endorses painful abdominal distension.  She has had soft stools for the past several weeks, with hourly bowel movements just prior to admission, these have slowed.  She denies any episodes of confusion.    Past Medical History:  Diagnosis Date   Bronchitis     Past Surgical History:  Procedure Laterality Date   TONSILLECTOMY      Allergies: Patient has no known allergies.  Medications: Prior to Admission medications   Not on File     History reviewed. No pertinent family history.  Social History   Socioeconomic History   Marital status: Divorced    Spouse name: Not on file   Number of children: Not on file   Years of education: Not on file   Highest education level: Not on file  Occupational History   Not on file  Tobacco Use   Smoking status: Every Day    Types: Cigarettes   Smokeless tobacco: Never  Substance and Sexual Activity   Alcohol use: Not Currently   Drug use: Never   Sexual activity: Not Currently  Other Topics Concern   Not on file  Social History Narrative   Not on file   Social Determinants of Health   Financial Resource Strain:  Not on file  Food Insecurity: Not on file  Transportation Needs: Not on file  Physical Activity: Not on file  Stress: Not on file  Social Connections: Not on file    Review of Systems: A 12 point ROS discussed and pertinent positives are indicated in the HPI above.  All other systems are negative.  Vital Signs: BP 97/71 (BP Location: Left Arm)   Pulse 64   Temp 98 F (36.7 C) (Oral)   Resp 14   Ht 5\' 4"  (1.626 m)   Wt 111.6 kg   SpO2 99%   BMI 42.23 kg/m   Physical Exam  Imaging: Echo 01/13/21:  EF 65-70%, RV function normal  CT AP 01/12/21:  Cirrhotic liver with large volume ascites - favorable anatomy for TIPS creation  Para 01/13/21 - 5 L Para 01/15/21 - 6.8 L Para 01/19/21 - 5 L Para 01/22/21 - 4.6 L  Labs:  CBC: Recent Labs    01/22/21 0400 01/23/21 0541 01/24/21 0504 01/25/21 0429  WBC 3.6* 3.2* 3.5* 3.8*  HGB 12.2 13.1 12.8 12.3  HCT 37.8 40.3 39.6 37.3  PLT 64* 59* 62* 69*    COAGS: Recent Labs    01/18/21 0751  INR 1.3*    BMP: Recent Labs    01/22/21 0400 01/23/21 0541 01/24/21 0504 01/25/21 0429  NA 133* 134* 136 134*  K 3.7 4.5 4.2 4.1  CL 95* 94* 97* 96*  CO2 33* 32 32 30  GLUCOSE 85 91 94 95  BUN 20 19 21 21   CALCIUM 8.5* 9.1 8.9 8.9  CREATININE 1.05* 1.16* 1.27* 1.34*  GFRNONAA 58* 51* 46* 43*    LIVER FUNCTION TESTS: Recent Labs    01/22/21 0400 01/23/21 0541 01/24/21 0504 01/25/21 0429  BILITOT 1.4* 1.4* 1.2 0.8  AST 29 37 38 37  ALT 12 13 14 14   ALKPHOS 39 44 41 48  PROT 5.6* 6.2* 5.7* 5.9*  ALBUMIN 2.9* 3.3* 3.1* 2.9*    MELD - Na = 15 (01/25/21)  TUMOR MARKERS: No results for input(s): AFPTM, CEA, CA199, CHROMGRNA in the last 8760 hours.  Assessment and Plan: 70 year old female with recently diagnosed, rapidly decompensating cirrhosis 01/27/21 B, MELD 15) with refractory ascites and worsening hepatorenal syndrome.  She is requiring paracentesis at least twice per week currently, and diuretics have been  difficult to manage.    We discussed the natural history of cirrhosis, portal hypertension, and periprocedural risks and benefits of TIPS creation.  She is amenable to proceed with TIPS creation.  Plan for TIPS creation at [facility] on 01/27/21 with general anesthesia.  Per the Hospitalist team, she will transfer from [facility] to [facility] today or tomorrow.  Recommend paracentesis as needed until TIPS.   Thank you for this interesting consult.  I greatly enjoyed meeting ADDA STOKES and look forward to participating in their care.  A copy of this report was sent to the requesting provider on this date.  Electronically Signed: [facility], MD 01/25/2021, 12:19 PM   I spent a total of 55 Mi[place]utes  in face to face in clinical consultation, greater than 50% of which was counseling/coordinating care for portal hypertension.

## 2021-01-25 NOTE — Discharge Summary (Signed)
Physician Discharge Summary  REVER PICHETTE PPI:[id number] DOB: [birth year] DOA: 01/12/2021  PCP: Oneita Hurt, No  Admit date: 01/12/2021 Discharge date: 01/25/2021  Admitted From: Home Disposition: Tertiary care [facility]  Recommendations for Outpatient Follow-up:  Follow up with PCP in 1-2 weeks Please obtain BMP/CBC in one week  Discharge Condition: Stable CODE STATUS: Full Diet recommendation: Low-salt diet  Brief/Interim Summary: 70 year old with no known past medical history admitted to the hospital for progressive bilateral lower extremity swelling for about 1 and half months, abdominal swelling found to have new onset of decompensated cirrhosis.  During hospitalization underwent large-volume paracentesis on 8/17, 8/19 complicated by soft blood pressure.  Received IV albumin and midodrine.  GI team was consulted, Lasix drip was discontinued and and started oral Aldactone, slowly titrating Lasix up, awaiting further discussion for TIPS procedure.   Assessment & Plan:   Decompensated liver cirrhosis with ascites, resolving Thrombocytopenia, hypoalbuminemia - Unknown etiology - acute hepatitis panel negative, TSH normal, HIV nonreactive.   - Autoimmune work-up ongoing with GI.  Echocardiogram shows EF 65 to 70%.  Lipid panel normal, A1c 4.4. Status post paracentesis 8/17, 8/19, 8/23, 8/26 - planned paracentesis 8/29 - Repeat paracentesis in the next 24-48h - Continues on midodrine 10 mg 3 times daily. - Currently on Aldactone - continue to titrate lasix as tolerated - 40mg  PO bid currently - Propranolol held due to borderline bradycardia at rest - Plan for outpatient follow-up with IR for TIPS procedure, once patient's volume status is stabilized will likely discharge home with close outpatient follow-up - if patient's volume status continues to be somewhat labile will likely need to discuss possible TIPS procedure in-house prior to discharge.   Diarrhea, improving -Improving, unlikely  infectious, patient transiently on antibiotics for UTI as below but labs not consistent with C. Difficile; no indication for stool culture/sample   Hypokalemia -Continue to follow, currently WNL   Trace Right sided pleural effusion - Likely from underlying liver cirrhosis   Vitamin D deficiency - Oral supplements   Folate deficiency Borderline vitamin B12 deficiency -Continue vitamin supplement   Urinary tract infection with Klebsiella -Completed course with Keflex previously   Follow-up Information     , NP. Go on 01/29/2021.   Specialty: Nurse Practitioner Why: Appointment at 1pm Friday Sept 2nd-  be sure to arrive at 1230 and take your photo ID, insurance information and any medications that you are currently taking in their original bottle. Contact information: [phone] [phone]         [phone], MD Follow up.   Specialties: Interventional Radiology, Diagnostic Radiology, Radiology Why: Our office will call patient for clinic consultation regarding TIPS Contact information: [address], [address] [place] [phone]                No Known Allergies  Consultations: GI, IR   Procedures/Studies: CT Abdomen Pelvis Wo Contrast  Result Date: 01/12/2021 CLINICAL DATA:  Abdominal distension EXAM: CT ABDOMEN AND PELVIS WITHOUT CONTRAST TECHNIQUE: Multidetector CT imaging of the abdomen and pelvis was performed following the standard protocol without IV contrast. COMPARISON:  None. FINDINGS: Lower chest: Lung bases demonstrate small right-sided pleural effusion. Atelectasis at the right base. Normal cardiac size. Small cardio phrenic lymph nodes. Small distal esophageal hiatal hernia. Hepatobiliary: Cirrhotic morphology of the liver. Multiple calcified gallstones. Subcentimeter hypodensity within the liver too small to further characterize. No biliary dilatation. Recanalized paraumbilical vein.  Pancreas: Unremarkable. No pancreatic  ductal dilatation or surrounding inflammatory changes. Spleen: Enlarged measuring 15.5 cm craniocaudad. Adrenals/Urinary Tract: Adrenal glands are normal. Multiple bilateral kidney stones without hydronephrosis. The bladder is difficult to see due to ascites. Stomach/Bowel: Centralization of the bowel. No bowel distension. No acute bowel wall thickening. Negative appendix. Vascular/Lymphatic: Mild aortic atherosclerosis. No aneurysm. No suspicious nodes. Tortuous portosystemic collaterals in the left upper quadrant. Reproductive: Uterus and bilateral adnexa are unremarkable. Other: Large volume of abdominopelvic ascites.  No free air. Musculoskeletal: No acute or suspicious osseous abnormality IMPRESSION: 1. Liver cirrhosis with portal hypertension. Large quantity of abdominopelvic ascites. 2. No evidence for bowel obstruction 3. Trace right-sided pleural effusion 4. Gallstones 5. Nonobstructing kidney stones Electronically Signed   By: Jasmine Pang M.D.   On: 01/12/2021 17:31   US Paracentesis  Result Date: 01/25/2021 INDICATION: Ascites EXAM: ULTRASOUND GUIDED  PARACENTESIS MEDICATIONS: None. COMPLICATIONS: None immediate. PROCEDURE: Informed written consent was obtained from the patient after a discussion of the risks, benefits and alternatives to treatment. A timeout was performed prior to the initiation of the procedure. Initial ultrasound scanning demonstrates a large amount of ascites within the right lower abdominal quadrant. The right lower abdomen was prepped and draped in the usual sterile fashion. 1% lidocaine was used for local anesthesia. Following this, a 19 gauge Yueh catheter was introduced. An ultrasound image was saved for documentation purposes. The paracentesis was performed. The catheter was removed and a dressing was applied. The patient tolerated the procedure well without immediate post procedural complication. FINDINGS: A total of approximately 4.6  L of clear, straw-colored fluid was removed. IMPRESSION: Successful ultrasound-guided paracentesis yielding 4.6 liters of clear peritoneal fluid. Electronically Signed   By: Olive Bass M.D.   On: 01/25/2021 08:17   US Paracentesis  Result Date: 01/20/2021 INDICATION: Ascites with request received for paracentesis. EXAM: ULTRASOUND GUIDED diagnostic and therapeutic PARACENTESIS MEDICATIONS: Local 1% lidocaine only. COMPLICATIONS: None immediate. PROCEDURE: Informed written consent was obtained from the patient after a discussion of the risks, benefits and alternatives to treatment. A timeout was performed prior to the initiation of the procedure. Initial ultrasound scanning demonstrates a large amount of ascites within the right lower abdominal quadrant. The right lower abdomen was prepped and draped in the usual sterile fashion. 1% lidocaine was used for local anesthesia. Following this, a 6 Fr Safe-T-Centesis catheter was introduced. An ultrasound image was saved for documentation purposes. The paracentesis was performed. The catheter was removed and a dressing was applied. The patient tolerated the procedure well without immediate post procedural complication. The procedure was stopped secondary to hypotension. FINDINGS: A total of approximately 5,000 mL of clear yellow fluid was removed. Samples were sent to the laboratory as requested by the clinical team. IMPRESSION: Successful ultrasound-guided paracentesis yielding 5 liters of peritoneal fluid. Read By: Pattricia Boss PA-C Electronically Signed   By: Olive Bass M.D.   On: 01/19/2021 16:46   US Paracentesis  Result Date: 01/15/2021 INDICATION: Patient with history of cirrhosis, portal hypertension, recurrent ascites. Request received for therapeutic paracentesis. EXAM: ULTRASOUND GUIDED  THERAPEUTIC PARACENTESIS MEDICATIONS: None. COMPLICATIONS: None immediate. PROCEDURE: Informed written consent was obtained from the patient after a discussion  of the risks, benefits and alternatives to treatment. A timeout was performed prior to the initiation of the procedure. Initial ultrasound scanning demonstrates a large amount of ascites within the left lower abdominal quadrant. The left lower abdomen was prepped and draped in the usual sterile fashion. 1% lidocaine was used for local anesthesia. Following this, a  19 gauge, 10-cm, Yueh catheter was introduced. An ultrasound image was saved for documentation purposes. The paracentesis was performed. The catheter was removed and a dressing was applied. The patient tolerated the procedure well without immediate post procedural complication. Patient received post-procedure intravenous albumin; see nursing notes for details. FINDINGS: A total of approximately 6.8 liters of yellow fluid was removed. IMPRESSION: Successful ultrasound-guided therapeutic paracentesis yielding 6.8 liters of peritoneal fluid. Read by: Jeananne Rama, PA-C Electronically Signed   By: Olive Bass M.D.   On: 01/15/2021 16:40   US Paracentesis  Result Date: 01/13/2021 INDICATION: Recurrent ascites request received for paracentesis EXAM: ULTRASOUND GUIDED THERAPEUTIC PARACENTESIS MEDICATIONS: Local 1% lidocaine only COMPLICATIONS: None immediate. PROCEDURE: Informed written consent was obtained from the patient after a discussion of the risks, benefits and alternatives to treatment. A timeout was performed prior to the initiation of the procedure. Initial ultrasound scanning demonstrates a large amount of ascites within the right upper abdominal quadrant. The right upper abdomen was prepped and draped in the usual sterile fashion. 1% lidocaine was used for local anesthesia. Following this, a 19 gauge, 7-cm, Yueh catheter was introduced. An ultrasound image was saved for documentation purposes. The paracentesis was performed. The catheter was removed and a dressing was applied. The patient tolerated the procedure well without immediate post  procedural complication. FINDINGS: A total of approximately 5 L of clear yellow fluid was removed. IMPRESSION: Successful ultrasound-guided paracentesis yielding 5 liters of peritoneal fluid. Read By: Pattricia Boss PA-C Electronically Signed   By: Judie Petit.  Shick M.D.   On: 01/13/2021 15:30   ECHOCARDIOGRAM COMPLETE  Result Date: 01/14/2021    ECHOCARDIOGRAM REPORT   Patient Name:   LYNE KHURANA Date of Exam: 01/14/2021 Medical Rec #:  [id number]        Height:       64.0 in Accession #:    [id number]       Weight:       246.0 lb Date of Birth:  06/20/1950       BSA:          2.136 m Patient Age:    69 years         BP:           118/59 mmHg Patient Gender: F                HR:           83 bpm. Exam Location:  [facility] Procedure: 2D Echo, Cardiac Doppler and Color Doppler Indications:     Dyspnea R06.00  History:         Patient has no prior history of Echocardiogram examinations.                  Bronchitis.  Sonographer:     Cristela Blue Referring Phys:  [id number] AMY N COX Diagnosing Phys: Marcina Millard MD  Sonographer Comments: Image acquisition challenging due to patient body habitus. IMPRESSIONS  1. Left ventricular ejection fraction, by estimation, is 65 to 70%. The left ventricle has normal function. The left ventricle has no regional wall motion abnormalities. Left ventricular diastolic parameters are consistent with Grade I diastolic dysfunction (impaired relaxation).  2. Right ventricular systolic function is normal. The right ventricular size is normal.  3. The mitral valve is normal in structure. Trivial mitral valve regurgitation. No evidence of mitral stenosis.  4. The aortic valve is normal in structure. Aortic valve regurgitation is not visualized. No aortic stenosis is present.  5. The inferior vena cava is normal in size with greater than 50% respiratory variability, suggesting right atrial pressure of 3 mmHg. FINDINGS  Left Ventricle: Left ventricular ejection fraction, by estimation, is 65 to  70%. The left ventricle has normal function. The left ventricle has no regional wall motion abnormalities. The left ventricular internal cavity size was normal in size. There is  no left ventricular hypertrophy. Left ventricular diastolic parameters are consistent with Grade I diastolic dysfunction (impaired relaxation). Right Ventricle: The right ventricular size is normal. No increase in right ventricular wall thickness. Right ventricular systolic function is normal. Left Atrium: Left atrial size was normal in size. Right Atrium: Right atrial size was normal in size. Pericardium: There is no evidence of pericardial effusion. Mitral Valve: The mitral valve is normal in structure. Trivial mitral valve regurgitation. No evidence of mitral valve stenosis. Tricuspid Valve: The tricuspid valve is normal in structure. Tricuspid valve regurgitation is trivial. No evidence of tricuspid stenosis. Aortic Valve: The aortic valve is normal in structure. Aortic valve regurgitation is not visualized. No aortic stenosis is present. Aortic valve mean gradient measures 4.0 mmHg. Aortic valve peak gradient measures 6.7 mmHg. Aortic valve area, by VTI measures 2.91 cm. Pulmonic Valve: The pulmonic valve was normal in structure. Pulmonic valve regurgitation is not visualized. No evidence of pulmonic stenosis. Aorta: The aortic root is normal in size and structure. Venous: The inferior vena cava is normal in size with greater than 50% respiratory variability, suggesting right atrial pressure of 3 mmHg. IAS/Shunts: No atrial level shunt detected by color flow Doppler.  LEFT VENTRICLE PLAX 2D LVIDd:         4.41 cm  Diastology LVIDs:         2.68 cm  LV e' medial:    4.90 cm/s LV PW:         1.17 cm  LV E/e' medial:  9.4 LV IVS:        0.79 cm  LV e' lateral:   6.20 cm/s LVOT diam:     2.00 cm  LV E/e' lateral: 7.5 LV SV:         68 LV SV Index:   32 LVOT Area:     3.14 cm  RIGHT VENTRICLE RV Basal diam:  3.13 cm RV S prime:     12.40  cm/s TAPSE (M-mode): 3.5 cm LEFT ATRIUM             Index       RIGHT ATRIUM           Index LA diam:        4.30 cm 2.01 cm/m  RA Area:     11.40 cm LA Vol (A2C):   49.9 ml 23.36 ml/m RA Volume:   22.20 ml  10.39 ml/m LA Vol (A4C):   33.4 ml 15.63 ml/m LA Biplane Vol: 43.3 ml 20.27 ml/m  AORTIC VALVE AV Area (Vmax):    2.90 cm AV Area (Vmean):   2.75 cm AV Area (VTI):     2.91 cm AV Vmax:           129.00 cm/s AV Vmean:          95.600 cm/s AV VTI:            0.234 m AV Peak Grad:      6.7 mmHg AV Mean Grad:      4.0 mmHg LVOT Vmax:         119.00 cm/s LVOT Vmean:  83.700 cm/s LVOT VTI:          0.217 m LVOT/AV VTI ratio: 0.93  AORTA Ao Root diam: 3.10 cm MITRAL VALVE               TRICUSPID VALVE MV Area (PHT): 3.01 cm    TR Peak grad:   27.5 mmHg MV Decel Time: 252 msec    TR Vmax:        262.00 cm/s MV E velocity: 46.30 cm/s MV A velocity: 83.10 cm/s  SHUNTS MV E/A ratio:  0.56        Systemic VTI:  0.22 m                            Systemic Diam: 2.00 cm Marcina MillardAlexander Paraschos MD Electronically signed by Marcina MillardAlexander Paraschos MD Signature Date/Time: 01/14/2021/1:20:09 PM    Final    US LIVER DOPPLER  Result Date: 01/18/2021 CLINICAL DATA:  Cirrhosis EXAM: DUPLEX ULTRASOUND OF LIVER TECHNIQUE: Color and duplex Doppler ultrasound was performed to evaluate the hepatic in-flow and out-flow vessels. COMPARISON:  CT 01/12/2021 FINDINGS: Liver: Coarse echotexture without focal lesion. Nodular hepatic contour. No focal lesion, mass or intrahepatic biliary ductal dilatation. Main Portal Vein size: 1.4 cm Portal Vein Velocities (all hepatopetal): Main Prox:  28 cm/sec Main Mid: 31 cm/sec Main Dist:  22 cm/sec Right: 20 cm/sec Left: 13 cm/sec Hepatic Vein Velocities (all hepatofugal): Right:  14 cm/sec Middle:  43 cm/sec Left:  27 cm/sec IVC: Present and patent with normal respiratory phasicity. Velocity 47 cm/sec Hepatic Artery Velocity:  49 cm/sec Splenic Vein Velocity:  21 cm/sec Spleen: 19 cm x 6 cm x 13  cm with a total volume of 739 cm^3 (411 cm^3 is upper limit normal) Portal Vein Occlusion/Thrombus: No Splenic Vein Occlusion/Thrombus: No Ascites: Present, mild Varices: None Recanalized umbilical vein incidentally noted. Gallbladder wall thickening up to 8 mm. IMPRESSION: 1. Unremarkable hepatic vascular Doppler evaluation. 2. Nodular hepatic contour suggesting cirrhosis, with stigmata of portal venous hypertension including splenomegaly and ascites. 3. Nonspecific gallbladder wall thickening. Electronically Signed   By: Corlis Leak  Hassell M.D.   On: 01/18/2021 13:25     Subjective: No acute issue/events overnight   Discharge Exam: Vitals:   01/25/21 0809 01/25/21 1130  BP: (!) 95/55 97/71  Pulse: (!) 53 64  Resp: 20 14  Temp: 97.7 F (36.5 C) 98 F (36.7 C)  SpO2: 98% 99%   Vitals:   01/24/21 2026 01/25/21 0335 01/25/21 0809 01/25/21 1130  BP: (!) 106/54 (!) 92/57 (!) 95/55 97/71  Pulse: (!) 52 (!) 56 (!) 53 64  Resp:  16 20 14   Temp: 98.1 F (36.7 C) 97.8 F (36.6 C) 97.7 F (36.5 C) 98 F (36.7 C)  TempSrc:  Oral  Oral  SpO2: 97% 97% 98% 99%  Weight:      Height:       General: Pt is alert, awake, not in acute distress Cardiovascular: RRR, S1/S2 +, no rubs, no gallops Respiratory: CTA bilaterally, no wheezing, no rhonchi Abdominal: Soft, NT, ND, bowel sounds +; fluid wave (moderate) Extremities: no edema, no cyanosis   The results of significant diagnostics from this hospitalization (including imaging, microbiology, ancillary and laboratory) are listed below for reference.     Microbiology: Recent Results (from the past 240 hour(s))  Body fluid culture w Gram Stain     Status: None   Collection Time: 01/19/21  4:00 PM   Specimen: PATH  Cytology Peritoneal fluid  Result Value Ref Range Status   Specimen Description   Final    PERITONEAL Performed at [facility], [address] [place], [place] [address]    Special Requests   Final    NONE Performed at  [facility], [address] [place], [place] [address]    Gram Stain   Final    NO ORGANISMS SEEN SQUAMOUS EPITHELIAL CELLS PRESENT FEW WBC PRESENT, PREDOMINANTLY MONONUCLEAR NO ORGANISMS SEEN BACTERIA    Culture   Final    NO GROWTH 3 DAYS Performed at [facility], [address] [place], [place] [address]    Report Status 01/23/2021 FINAL  Final   Labs: BNP (last 3 results) No results for input(s): BNP in the last 8760 hours. Basic Metabolic Panel: Recent Labs  Lab 01/19/21 0439 01/20/21 0527 01/21/21 0456 01/22/21 0400 01/23/21 0541 01/24/21 0504 01/25/21 0429  NA 137 134* 132* 133* 134* 136 134*  K 3.4* 3.5 4.2 3.7 4.5 4.2 4.1  CL 93* 92* 92* 95* 94* 97* 96*  CO2 34* 33* 33* 33* 32 32 30  GLUCOSE 112* 114* 98 85 91 94 95  BUN CREATININE 1.03* 1.10* 1.13* 1.05* 1.16* 1.27* 1.34*  CALCIUM 8.6* 8.9 9.1 8.5* 9.1 8.9 8.9  MG 1.9 1.9 1.9 1.8  --   --   --    Liver Function Tests: Recent Labs  Lab 01/21/21 0456 01/22/21 0400 01/23/21 0541 01/24/21 0504 01/25/21 0429  AST 29 29 37 38 37  ALT ALKPHOS 39 39 44 41 48  BILITOT 1.4* 1.4* 1.4* 1.2 0.8  PROT 6.1* 5.6* 6.2* 5.7* 5.9*  ALBUMIN 3.3* 2.9* 3.3* 3.1* 2.9*   No results for input(s): LIPASE, AMYLASE in the last 168 hours. No results for input(s): AMMONIA in the last 168 hours. CBC: Recent Labs  Lab 01/21/21 0456 01/22/21 0400 01/23/21 0541 01/24/21 0504 01/25/21 0429  WBC 3.4* 3.6* 3.2* 3.5* 3.8*  HGB 12.8 12.2 13.1 12.8 12.3  HCT 39.0 37.8 40.3 39.6 37.3  MCV 81.1 79.2* 79.2* 80.3 80.4  PLT 67* 64* 59* 62* 69*   Cardiac Enzymes: No results for input(s): CKTOTAL, CKMB, CKMBINDEX, TROPONINI in the last 168 hours. BNP: Invalid input(s): POCBNP CBG: Recent Labs  Lab 01/23/21 0726  GLUCAP 89   D-Dimer No results for input(s): DDIMER in the last 72 hours. Hgb A1c No results for input(s): HGBA1C in the last 72 hours. Lipid Profile No  results for input(s): CHOL, HDL, LDLCALC, TRIG, CHOLHDL, LDLDIRECT in the last 72 hours. Thyroid function studies No results for input(s): TSH, T4TOTAL, T3FREE, THYROIDAB in the last 72 hours.  Invalid input(s): FREET3 Anemia work up No results for input(s): VITAMINB12, FOLATE, FERRITIN, TIBC, IRON, RETICCTPCT in the last 72 hours. Urinalysis    Component Value Date/Time   COLORURINE AMBER (A) 01/12/2021 2008   APPEARANCEUR CLOUDY (A) 01/12/2021 2008   LABSPEC 1.027 01/12/2021 2008   PHURINE 6.0 01/12/2021 2008   GLUCOSEU NEGATIVE 01/12/2021 2008   HGBUR MODERATE (A) 01/12/2021 2008   BILIRUBINUR SMALL (A) 01/12/2021 2008   KETONESUR 20 (A) 01/12/2021 2008   PROTEINUR 100 (A) 01/12/2021 2008   NITRITE NEGATIVE 01/12/2021 2008   LEUKOCYTESUR LARGE (A) 01/12/2021 2008   Sepsis Labs Invalid input(s): PROCALCITONIN,  WBC,  LACTICIDVEN Microbiology Recent Results (from the past 240 hour(s))  Body fluid culture w Gram Stain  Status: None   Collection Time: 01/19/21  4:00 PM   Specimen: PATH Cytology Peritoneal fluid  Result Value Ref Range Status   Specimen Description   Final    PERITONEAL Performed at [facility], [address] [place], [place] [address]    Special Requests   Final    NONE Performed at [facility], [address] [place], [place] [address]    Gram Stain   Final    NO ORGANISMS SEEN SQUAMOUS EPITHELIAL CELLS PRESENT FEW WBC PRESENT, PREDOMINANTLY MONONUCLEAR NO ORGANISMS SEEN BACTERIA    Culture   Final    NO GROWTH 3 DAYS Performed at [facility], [address] [place], [place] [address]    Report Status 01/23/2021 FINAL  Final   Time coordinating discharge: Over 30 minutes  SIGNED:  Azucena Fallen, DO [facility] 01/25/2021, 1:59 PM Pager   If 7PM-7AM, please contact night-coverage [email or web address]

## 2021-01-26 ENCOUNTER — Inpatient Hospital Stay (HOSPITAL_COMMUNITY)
Admission: AD | Admit: 2021-01-26 | Discharge: 2021-01-29 | DRG: 405 | Disposition: A | Discharge status: Home Health | Payer: Medicare Other | Source: Other Acute Inpatient Hospital | Attending: Family Medicine | Admitting: Family Medicine

## 2021-01-26 DIAGNOSIS — R188 Other ascites: Secondary | ICD-10-CM | POA: Diagnosis present

## 2021-01-26 DIAGNOSIS — K766 Portal hypertension: Secondary | ICD-10-CM | POA: Diagnosis present

## 2021-01-26 DIAGNOSIS — D696 Thrombocytopenia, unspecified: Secondary | ICD-10-CM

## 2021-01-26 DIAGNOSIS — D6959 Other secondary thrombocytopenia: Secondary | ICD-10-CM | POA: Diagnosis present

## 2021-01-26 DIAGNOSIS — Z20822 Contact with and (suspected) exposure to covid-19: Secondary | ICD-10-CM | POA: Diagnosis present

## 2021-01-26 DIAGNOSIS — F17210 Nicotine dependence, cigarettes, uncomplicated: Secondary | ICD-10-CM | POA: Diagnosis present

## 2021-01-26 DIAGNOSIS — K7469 Other cirrhosis of liver: Principal | ICD-10-CM | POA: Diagnosis present

## 2021-01-26 DIAGNOSIS — I5032 Chronic diastolic (congestive) heart failure: Secondary | ICD-10-CM | POA: Diagnosis present

## 2021-01-26 DIAGNOSIS — K7460 Unspecified cirrhosis of liver: Secondary | ICD-10-CM | POA: Diagnosis not present

## 2021-01-26 DIAGNOSIS — N181 Chronic kidney disease, stage 1: Secondary | ICD-10-CM | POA: Diagnosis present

## 2021-01-26 DIAGNOSIS — E869 Volume depletion, unspecified: Secondary | ICD-10-CM | POA: Diagnosis present

## 2021-01-26 DIAGNOSIS — E538 Deficiency of other specified B group vitamins: Secondary | ICD-10-CM | POA: Diagnosis present

## 2021-01-26 DIAGNOSIS — Z6841 Body Mass Index (BMI) 40.0 and over, adult: Secondary | ICD-10-CM | POA: Diagnosis not present

## 2021-01-26 DIAGNOSIS — E876 Hypokalemia: Secondary | ICD-10-CM | POA: Diagnosis present

## 2021-01-26 DIAGNOSIS — N170 Acute kidney failure with tubular necrosis: Secondary | ICD-10-CM | POA: Diagnosis present

## 2021-01-26 DIAGNOSIS — E875 Hyperkalemia: Secondary | ICD-10-CM | POA: Diagnosis present

## 2021-01-26 DIAGNOSIS — J90 Pleural effusion, not elsewhere classified: Secondary | ICD-10-CM | POA: Diagnosis not present

## 2021-01-26 DIAGNOSIS — D689 Coagulation defect, unspecified: Secondary | ICD-10-CM | POA: Diagnosis present

## 2021-01-26 NOTE — Progress Notes (Signed)
Report given to receiving nurse at [facility]. Discussed pt with Toniann Fail MD one of the admitting physicians at [facility]. Pt belongings gathered, VSS. Transferred via stretcher by [facility].

## 2021-01-26 NOTE — Progress Notes (Signed)
Received patient transfer from [facility]. Via ambulance, AOx4, denies pain, VSS with brady PR at 54, oriented to room, bed controls, call light and plan of of care, scheduled for TIPs by IR tomorrow.  [facility] text paged of patient's admission and Dr. Leafy Half will be the admitting MD.  Awaiting further orders.

## 2021-01-26 NOTE — Progress Notes (Signed)
Physical Therapy Treatment Patient Details Name: Joan Roman MRN: [id number] DOB: 1950/09/06 Today's Date: 01/26/2021    History of Present Illness 70 year old with no known past medical history admitted to the hospital for progressive bilateral lower extremity swelling for about 1 and half months, abdominal swelling found to have new onset of decompensated cirrhosis.  During hospitalization underwent large-volume paracentesis on 8/17, 8/19 complicated by soft blood pressure and copious loose stools.    PT Comments    Pt resting in bed upon PT arrival and reports waiting to be transferred to another hospital; agreeable to PT session.  Modified independent with bed mobility; SBA with transfers using RW; and CGA to SBA ambulating 190 feet with RW.  Tolerated sitting and semi-supine LE ex's in bed fairly well.  Will continue to focus on strengthening, balance, and progressive functional mobility during hospitalization.   Follow Up Recommendations  Home health PT;Supervision for mobility/OOB     Equipment Recommendations  Rolling walker with 5" wheels;3in1 (PT)    Recommendations for Other Services       Precautions / Restrictions Precautions Precautions: Fall Restrictions Weight Bearing Restrictions: No    Mobility  Bed Mobility Overal bed mobility: Modified Independent Bed Mobility: Supine to Sit;Sit to Supine   Sidelying to sit: Modified independent (Device/Increase time);HOB elevated Supine to sit: Modified independent (Device/Increase time);HOB elevated     General bed mobility comments: mild increased effort to perform on own    Transfers Overall transfer level: Needs assistance Equipment used: Rolling walker (2 wheeled) Transfers: Sit to/from Stand Sit to Stand: Supervision         General transfer comment: increased effort to stand up to RW; vc's for UE placement  Ambulation/Gait Ambulation/Gait assistance: Min guard;Supervision Gait Distance (Feet):  190 Feet Assistive device: Rolling walker (2 wheeled)   Gait velocity: decreased   General Gait Details: decreased B LE step length; increased B lateral sway but overall steady with RW use   Stairs             Wheelchair Mobility    Modified Rankin (Stroke Patients Only)       Balance Overall balance assessment: Needs assistance Sitting-balance support: No upper extremity supported Sitting balance-Leahy Scale: Good Sitting balance - Comments: steady sitting reaching within BOS   Standing balance support: Bilateral upper extremity supported;During functional activity Standing balance-Leahy Scale: Good Standing balance comment: increased B lateral sway with ambulation but overall steady using RW                            Cognition Arousal/Alertness: Awake/alert Behavior During Therapy: WFL for tasks assessed/performed Overall Cognitive Status: Within Functional Limits for tasks assessed                                        Exercises General Exercises - Lower Extremity Short Arc Quad: AROM;Strengthening;Both;10 reps;Supine Long Arc Quad: AROM;Strengthening;Both;Seated (10 reps x2) Heel Slides: AROM;Strengthening;Both;10 reps;Supine Hip Flexion/Marching: AROM;Strengthening;Both;Seated (10 reps x2)    General Comments  Nursing cleared pt for participation in physical therapy.  Pt agreeable to PT session.      Pertinent Vitals/Pain Pain Assessment: No/denies pain Pain Intervention(s): Limited activity within patient's tolerance;Monitored during session;Repositioned Vitals (HR and O2 on room air) stable and WFL throughout treatment session.    Home Living  Prior Function            PT Goals (current goals can now be found in the care plan section) Acute Rehab PT Goals Patient Stated Goal: go to my brother's in [place] PT Goal Formulation: With patient Time For Goal Achievement: 02/03/21 Potential  to Achieve Goals: Good Progress towards PT goals: Progressing toward goals    Frequency    Min 2X/week      PT Plan Current plan remains appropriate    Co-evaluation              AM-PAC PT "6 Clicks" Mobility   Outcome Measure  Help needed turning from your back to your side while in a flat bed without using bedrails?: None Help needed moving from lying on your back to sitting on the side of a flat bed without using bedrails?: None Help needed moving to and from a bed to a chair (including a wheelchair)?: A Little Help needed standing up from a chair using your arms (e.g., wheelchair or bedside chair)?: A Little Help needed to walk in hospital room?: A Little Help needed climbing 3-5 steps with a railing? : A Little 6 Click Score: 20    End of Session Equipment Utilized During Treatment: Gait belt Activity Tolerance: Patient tolerated treatment well Patient left: in bed;with call bell/phone within reach Nurse Communication: Mobility status;Precautions PT Visit Diagnosis: Muscle weakness (generalized) (M62.81);Difficulty in walking, not elsewhere classified (R26.2);Unsteadiness on feet (R26.81)     Time: 3500-9381 PT Time Calculation (min) (ACUTE ONLY): 28 min  Charges:  $Gait Training: 8-22 mins $Therapeutic Exercise: 8-22 mins                     Hendricks Limes, PT 01/26/21, 6:11 PM

## 2021-01-27 ENCOUNTER — Inpatient Hospital Stay (HOSPITAL_COMMUNITY): Payer: Medicare Other | Admitting: Certified Registered Nurse Anesthetist

## 2021-01-27 ENCOUNTER — Encounter (HOSPITAL_COMMUNITY): Payer: Self-pay | Admitting: Internal Medicine

## 2021-01-27 ENCOUNTER — Other Ambulatory Visit (HOSPITAL_COMMUNITY): Payer: Self-pay

## 2021-01-27 ENCOUNTER — Encounter (HOSPITAL_COMMUNITY): Admission: AD | Disposition: A | Discharge status: Home Health | Payer: Self-pay | Attending: Family Medicine

## 2021-01-27 ENCOUNTER — Inpatient Hospital Stay (HOSPITAL_COMMUNITY): Payer: Medicare Other

## 2021-01-27 DIAGNOSIS — R188 Other ascites: Secondary | ICD-10-CM | POA: Diagnosis not present

## 2021-01-27 DIAGNOSIS — F17210 Nicotine dependence, cigarettes, uncomplicated: Secondary | ICD-10-CM | POA: Diagnosis present

## 2021-01-27 DIAGNOSIS — I5032 Chronic diastolic (congestive) heart failure: Secondary | ICD-10-CM | POA: Diagnosis present

## 2021-01-27 DIAGNOSIS — K7460 Unspecified cirrhosis of liver: Secondary | ICD-10-CM | POA: Diagnosis not present

## 2021-01-27 HISTORY — PX: IR TIPS: IMG2295

## 2021-01-27 HISTORY — PX: RADIOLOGY WITH ANESTHESIA: SHX6223

## 2021-01-27 LAB — CBC WITH DIFFERENTIAL/PLATELET
Abs Immature Granulocytes: 0.01 10*3/uL (ref 0.00–0.07)
Basophils Absolute: 0 10*3/uL (ref 0.0–0.1)
Basophils Relative: 1 %
Eosinophils Absolute: 0.1 10*3/uL (ref 0.0–0.5)
Eosinophils Relative: 3 %
HCT: 38.6 % (ref 36.0–46.0)
Hemoglobin: 12.5 g/dL (ref 12.0–15.0)
Immature Granulocytes: 0 %
Lymphocytes Relative: 20 %
Lymphs Abs: 0.7 10*3/uL (ref 0.7–4.0)
MCH: 25.9 pg — ABNORMAL LOW (ref 26.0–34.0)
MCHC: 32.4 g/dL (ref 30.0–36.0)
MCV: 80.1 fL (ref 80.0–100.0)
Monocytes Absolute: 0.5 10*3/uL (ref 0.1–1.0)
Monocytes Relative: 14 %
Neutro Abs: 2 10*3/uL (ref 1.7–7.7)
Neutrophils Relative %: 62 %
Platelets: 68 10*3/uL — ABNORMAL LOW (ref 150–400)
RBC: 4.82 MIL/uL (ref 3.87–5.11)
RDW: 17.8 % — ABNORMAL HIGH (ref 11.5–15.5)
WBC: 3.2 10*3/uL — ABNORMAL LOW (ref 4.0–10.5)
nRBC: 0 % (ref 0.0–0.2)

## 2021-01-27 LAB — BASIC METABOLIC PANEL
Anion gap: 7 (ref 5–15)
BUN: 26 mg/dL — ABNORMAL HIGH (ref 8–23)
CO2: 31 mmol/L (ref 22–32)
Calcium: 8.9 mg/dL (ref 8.9–10.3)
Chloride: 95 mmol/L — ABNORMAL LOW (ref 98–111)
Creatinine, Ser: 1.65 mg/dL — ABNORMAL HIGH (ref 0.44–1.00)
GFR, Estimated: 34 mL/min — ABNORMAL LOW (ref >60)
Glucose, Bld: 94 mg/dL (ref 70–99)
Potassium: 4.3 mmol/L (ref 3.5–5.1)
Sodium: 133 mmol/L — ABNORMAL LOW (ref 135–145)

## 2021-01-27 LAB — TYPE AND SCREEN
ABO/RH(D): A POS
Antibody Screen: NEGATIVE

## 2021-01-27 LAB — APTT: aPTT: 31 seconds (ref 24–36)

## 2021-01-27 LAB — HEPATIC FUNCTION PANEL
ALT: 14 U/L (ref 0–44)
AST: 31 U/L (ref 15–41)
Albumin: 2.9 g/dL — ABNORMAL LOW (ref 3.5–5.0)
Alkaline Phosphatase: 47 U/L (ref 38–126)
Bilirubin, Direct: 0.2 mg/dL (ref 0.0–0.2)
Indirect Bilirubin: 0.9 mg/dL (ref 0.3–0.9)
Total Bilirubin: 1.1 mg/dL (ref 0.3–1.2)
Total Protein: 6.2 g/dL — ABNORMAL LOW (ref 6.5–8.1)

## 2021-01-27 LAB — ABO/RH: ABO/RH(D): A POS

## 2021-01-27 LAB — PROTIME-INR
INR: 1.2 (ref 0.8–1.2)
Prothrombin Time: 15.2 seconds (ref 11.4–15.2)

## 2021-01-27 SURGERY — IR WITH ANESTHESIA
Anesthesia: General

## 2021-01-27 MED ORDER — LACTATED RINGERS IV SOLN: INTRAVENOUS | Status: DC

## 2021-01-27 MED ORDER — ONDANSETRON HCL 4 MG/2ML IJ SOLN
INTRAMUSCULAR | Status: DC | PRN
  Administered 2021-01-27: 4 mg via INTRAVENOUS

## 2021-01-27 MED ORDER — FUROSEMIDE 40 MG PO TABS
40.0000 mg | ORAL_TABLET | Freq: Every day | ORAL | Status: DC
  Filled 2021-01-27: qty 1

## 2021-01-27 MED ORDER — EPHEDRINE SULFATE-NACL 50-0.9 MG/10ML-% IV SOSY
PREFILLED_SYRINGE | INTRAVENOUS | Status: DC | PRN
  Administered 2021-01-27: 5 mg via INTRAVENOUS

## 2021-01-27 MED ORDER — FENTANYL CITRATE (PF) 100 MCG/2ML IJ SOLN
INTRAMUSCULAR | Status: AC
  Filled 2021-01-27: qty 2

## 2021-01-27 MED ORDER — ONDANSETRON HCL 4 MG/2ML IJ SOLN
INTRAMUSCULAR | Status: AC
  Administered 2021-01-27: 4 mg via INTRAVENOUS
  Filled 2021-01-27: qty 2

## 2021-01-27 MED ORDER — ORAL CARE MOUTH RINSE: 15.0000 mL | Freq: Once | OROMUCOSAL | Status: AC

## 2021-01-27 MED ORDER — MIDAZOLAM HCL 5 MG/5ML IJ SOLN
INTRAMUSCULAR | Status: DC | PRN
Start: 2021-01-27 — End: 2021-01-27
  Administered 2021-01-27: 1 mg via INTRAVENOUS

## 2021-01-27 MED ORDER — PROPOFOL 10 MG/ML IV BOLUS
INTRAVENOUS | Status: DC | PRN
  Administered 2021-01-27: 60 mg via INTRAVENOUS
  Administered 2021-01-27: 140 mg via INTRAVENOUS

## 2021-01-27 MED ORDER — FENTANYL CITRATE (PF) 100 MCG/2ML IJ SOLN
INTRAMUSCULAR | Status: DC | PRN
  Administered 2021-01-27 (×3): 50 ug via INTRAVENOUS

## 2021-01-27 MED ORDER — ONDANSETRON HCL 4 MG/2ML IJ SOLN: 4.0000 mg | Freq: Once | INTRAMUSCULAR | Status: AC | PRN

## 2021-01-27 MED ORDER — SUGAMMADEX SODIUM 200 MG/2ML IV SOLN
INTRAVENOUS | Status: DC | PRN
  Administered 2021-01-27 (×4): 100 mg via INTRAVENOUS

## 2021-01-27 MED ORDER — LIDOCAINE 2% (20 MG/ML) 5 ML SYRINGE
INTRAMUSCULAR | Status: DC | PRN
  Administered 2021-01-27: 100 mg via INTRAVENOUS

## 2021-01-27 MED ORDER — OXYCODONE HCL 5 MG PO TABS: 5.0000 mg | ORAL_TABLET | Freq: Once | ORAL | Status: DC | PRN

## 2021-01-27 MED ORDER — DEXAMETHASONE SODIUM PHOSPHATE 10 MG/ML IJ SOLN
INTRAMUSCULAR | Status: DC | PRN
  Administered 2021-01-27: 5 mg via INTRAVENOUS

## 2021-01-27 MED ORDER — IOHEXOL 240 MG/ML SOLN
50.0000 mL | Freq: Once | INTRAMUSCULAR | Status: AC
  Administered 2021-01-27: 24 mL via INTRAVENOUS

## 2021-01-27 MED ORDER — CHLORHEXIDINE GLUCONATE 0.12 % MT SOLN
OROMUCOSAL | Status: AC
  Administered 2021-01-27: 15 mL via OROMUCOSAL
  Filled 2021-01-27: qty 15

## 2021-01-27 MED ORDER — PHENYLEPHRINE 40 MCG/ML (10ML) SYRINGE FOR IV PUSH (FOR BLOOD PRESSURE SUPPORT)
PREFILLED_SYRINGE | INTRAVENOUS | Status: DC | PRN
  Administered 2021-01-27: 40 ug via INTRAVENOUS
  Administered 2021-01-27: 80 ug via INTRAVENOUS
  Administered 2021-01-27: 40 ug via INTRAVENOUS
  Administered 2021-01-27: 80 ug via INTRAVENOUS

## 2021-01-27 MED ORDER — AMISULPRIDE (ANTIEMETIC) 5 MG/2ML IV SOLN: 10.0000 mg | Freq: Once | INTRAVENOUS | Status: DC | PRN

## 2021-01-27 MED ORDER — ACETAMINOPHEN 325 MG PO TABS
325.0000 mg | ORAL_TABLET | Freq: Four times a day (QID) | ORAL | Status: DC | PRN
Start: 2021-01-27 — End: 2021-01-27

## 2021-01-27 MED ORDER — FENTANYL CITRATE (PF) 100 MCG/2ML IJ SOLN: 25.0000 ug | INTRAMUSCULAR | Status: DC | PRN

## 2021-01-27 MED ORDER — SPIRONOLACTONE 100 MG PO TABS
100.0000 mg | ORAL_TABLET | Freq: Every day | ORAL | Status: DC
  Filled 2021-01-27: qty 1

## 2021-01-27 MED ORDER — POLYETHYLENE GLYCOL 3350 17 G PO PACK: 17.0000 g | PACK | Freq: Every day | ORAL | Status: DC | PRN

## 2021-01-27 MED ORDER — OXYCODONE HCL 5 MG/5ML PO SOLN: 5.0000 mg | Freq: Once | ORAL | Status: DC | PRN

## 2021-01-27 MED ORDER — SODIUM CHLORIDE 0.9 % IV SOLN
2.0000 g | INTRAVENOUS | Status: AC
  Administered 2021-01-27: 2 g via INTRAVENOUS
  Filled 2021-01-27 (×2): qty 20

## 2021-01-27 MED ORDER — ACETAMINOPHEN 650 MG RE SUPP: 325.0000 mg | Freq: Four times a day (QID) | RECTAL | Status: DC | PRN

## 2021-01-27 MED ORDER — ONDANSETRON HCL 4 MG/2ML IJ SOLN
4.0000 mg | Freq: Four times a day (QID) | INTRAMUSCULAR | Status: DC | PRN
  Administered 2021-01-27: 4 mg via INTRAVENOUS
  Filled 2021-01-27: qty 2

## 2021-01-27 MED ORDER — CHLORHEXIDINE GLUCONATE 0.12 % MT SOLN: 15.0000 mL | Freq: Once | OROMUCOSAL | Status: AC

## 2021-01-27 MED ORDER — PHENYLEPHRINE HCL-NACL 20-0.9 MG/250ML-% IV SOLN
INTRAVENOUS | Status: DC | PRN
  Administered 2021-01-27: 50 ug/min via INTRAVENOUS

## 2021-01-27 MED ORDER — OXYCODONE HCL 5 MG PO TABS
5.0000 mg | ORAL_TABLET | Freq: Four times a day (QID) | ORAL | Status: DC | PRN
  Administered 2021-01-28: 5 mg via ORAL
  Filled 2021-01-27: qty 1

## 2021-01-27 MED ORDER — ROCURONIUM BROMIDE 10 MG/ML (PF) SYRINGE
PREFILLED_SYRINGE | INTRAVENOUS | Status: DC | PRN
Start: 2021-01-27 — End: 2021-01-27
  Administered 2021-01-27: 70 mg via INTRAVENOUS
  Administered 2021-01-27: 30 mg via INTRAVENOUS

## 2021-01-27 MED ORDER — ONDANSETRON HCL 4 MG PO TABS: 4.0000 mg | ORAL_TABLET | Freq: Four times a day (QID) | ORAL | Status: DC | PRN

## 2021-01-27 MED ORDER — SUGAMMADEX SODIUM 200 MG/2ML IV SOLN: INTRAVENOUS | Status: DC | PRN

## 2021-01-27 NOTE — Anesthesia Procedure Notes (Signed)
Procedure Name: Intubation Date/Time: 01/27/2021 1:14 PM Performed by: Janene Harvey, CRNA Pre-anesthesia Checklist: Patient identified, Emergency Drugs available, Suction available and Patient being monitored Patient Re-evaluated:Patient Re-evaluated prior to induction Oxygen Delivery Method: Circle system utilized Preoxygenation: Pre-oxygenation with 100% oxygen Induction Type: IV induction Ventilation: Mask ventilation without difficulty Laryngoscope Size: Mac and 4 Grade View: Grade III Tube type: Oral Tube size: 7.0 mm Number of attempts: 1 Airway Equipment and Method: Stylet and Oral airway Placement Confirmation: ETT inserted through vocal cords under direct vision, positive ETCO2 and breath sounds checked- equal and bilateral Secured at: 22 cm Tube secured with: Tape Dental Injury: Teeth and Oropharynx as per pre-operative assessment

## 2021-01-27 NOTE — Sedation Documentation (Signed)
Pt transported to PACU bay 18 via bed accompanied by RN and CRNA. PACU RN at bedside. Handoff completed. Right neck and right groin puncture site dressings clean dry and intact without any bleeding of swelling. No s/s of distress at this time.

## 2021-01-27 NOTE — Progress Notes (Signed)
PROGRESS NOTE   Joan Roman  FGH:[id number] DOB: 1950-11-10 DOA: 01/26/2021 PCP: Pcp, No  Brief Narrative:   70 year old white female Recent diagnosis cirrhosis (etiology unclear hepatitis panel negative TSH, HIV nonreactive--?  EtOH/NASH-autoimmune work-up pending) with ascites requiring frequent large-volume paracentesis 8/17 8/19 8/23 8/26--intolerant diuretics secondary to hypotension started on midodrine--patient transferred to [facility] for TIPS procedure + Abdominal pain + right-sided pain + episodic diarrhea  Hospital-Problem based course  Decompensated cryptogenic cirrhosis, Meld-Na 13 ~ 50% mortality risk Autoimmune work-up apparently in process in the outpatient setting  Platelets 60-50 and stable Status post TIPS procedure Dr. Elby Showers 8/31 2022 Defer further planning re: procedure to IR, defer further management of cirrhosis to GI  propranolol held secondary to bradycardia-remains on was on midodrine in the last hospital stay-this has been held at this time Undifferentiated diarrhea Low risk for C. difficile-monitor trends Hypokalemia Resolved ATN superimposed on CKD 1 Patient on multiple diuretics Aldactone 100: Lasix 40--dosing to be adjusted post procedure HFpEF based on echo 8/18 Diuretics as above   DVT prophylaxis: SCD Code Status: Full Family Communication: None present patient seen in PACU Disposition:  Status is: Inpatient  Remains inpatient appropriate because:Hemodynamically unstable, Persistent severe electrolyte disturbances, and Unsafe d/c plan  Dispo: The patient is from: Home              Anticipated d/c is to: SNF              Patient currently is not medically stable to d/c.   Difficult to place patient No       Consultants:  Interventional radiology  Procedures: TIPS procedure 8/31  Antimicrobials: Perioperative   Subjective: Seen in the PACU patient fast asleep but was arousable earlier Cannot obtain ROS  Objective: Vitals:    01/27/21 0516 01/27/21 1122 01/27/21 1600  BP: 101/60 101/65   Pulse: 62 71   Resp: 17 17   Temp: 97.9 F (36.6 C) 98.2 F (36.8 C) 97.9 F (36.6 C)  TempSrc:  Oral   SpO2: 98% 99%   Weight:  111.6 kg   Height:  5\' 4"  (1.626 m)     Intake/Output Summary (Last 24 hours) at 01/27/2021 1617 Last data filed at 01/27/2021 1531 Gross per 24 hour  Intake 1220 ml  Output 30 ml  Net 1190 ml   Filed Weights   01/27/21 1122  Weight: 111.6 kg    Examination:  Comfortable appearing white female Bandage over her right neck S1-S2 HSM LLSE Abdomen obese nontender no rebound Cannot appreciate asterixis cannot perform full neuro exam  Data Reviewed: personally reviewed   CBC    Component Value Date/Time   WBC 3.2 (L) 01/27/2021 0418   RBC 4.82 01/27/2021 0418   HGB 12.5 01/27/2021 0418   HCT 38.6 01/27/2021 0418   PLT 68 (L) 01/27/2021 0418   MCV 80.1 01/27/2021 0418   MCH 25.9 (L) 01/27/2021 0418   MCHC 32.4 01/27/2021 0418   RDW 17.8 (H) 01/27/2021 0418   LYMPHSABS 0.7 01/27/2021 0418   MONOABS 0.5 01/27/2021 0418   EOSABS 0.1 01/27/2021 0418   BASOSABS 0.0 01/27/2021 0418   CMP Latest Ref Rng & Units 01/27/2021 01/25/2021 01/24/2021  Glucose 70 - 99 mg/dL 94 95 94  BUN 8 - 23 mg/dL 01/26/2021) 21 21  Creatinine 0.44 - 1.00 mg/dL 67(E) 9.38(B) 0.17(P)  Sodium 135 - 145 mmol/L 133(L) 134(L) 136  Potassium 3.5 - 5.1 mmol/L 4.3 4.1 4.2  Chloride 98 - 111 mmol/L 95(L)  96(L) 97(L)  CO2 22 - 32 mmol/L 31 30 32  Calcium 8.9 - 10.3 mg/dL 8.9 8.9 8.9  Total Protein 6.5 - 8.1 g/dL 6.2(L) 5.9(L) 5.7(L)  Total Bilirubin 0.3 - 1.2 mg/dL 1.1 0.8 1.2  Alkaline Phos 38 - 126 U/L 47 48 41  AST 15 - 41 U/L 31 37 38  ALT 0 - 44 U/L 14 14 14      Radiology Studies: IR Tips  Result Date: 01/27/2021 CLINICAL DATA:  70 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 EXAM: 1. Ultrasound-guided vascular access of the right internal jugular vein and right greater saphenous  vein. 2. Intravascular ultrasound. 3. Central and portal venous manometry. 4. TIPS creation. MEDICATIONS: As antibiotic prophylaxis, 2 g Rocephin, intravenous was ordered pre-procedure and administered intravenously within one hour of incision. ANESTHESIA/SEDATION: General - as administered by the Anesthesia department CONTRAST:  74 mL Omnipaque 240, intravenous FLUOROSCOPY TIME:  Fluoroscopy Time: 29 minutes 0 seconds (758 mGy). COMPLICATIONS: None immediate. PROCEDURE: Informed written consent was obtained from the patient after a thorough discussion of the procedural risks, benefits and alternatives. All questions were addressed. Maximal Sterile Barrier Technique was utilized including caps, mask, sterile gowns, sterile gloves, sterile drape, hand hygiene and skin antiseptic. A timeout was performed prior to the initiation of the procedure. Preprocedure ultrasound evaluation demonstrated patency of the right greater saphenous vein. A small skin nick was made at the planned needle entry site. Under direct ultrasound visualization, a 21 gauge micropuncture needle was directed into the right common femoral vein. A permanent image was captured and stored in the record. The micropuncture set was exchanged over a J wire for an 8 73, 11 cm vascular sheath. Through the vascular sheath, an ICE catheter was directed under fluoroscopic and ultrasound guidance to the level of the perihepatic inferior vena cava. Evaluation of hepatic vasculature was performed which demonstrated a patent appearing and normal caliber portal vein, patent appearing right and middle hepatic veins. There was an optimal window from the right hepatic vein to the central right portal vein. Preprocedure ultrasound evaluation demonstrated patency of the right internal jugular vein. A small skin nick was made at the planned needle entry site. Under direct ultrasound visualization, a 21 gauge micropuncture needle was directed into the right internal  jugular vein. A permanent image was captured and stored in the record. The micropuncture set was exchanged over a Rosen wire for a 10 French tips sheath after serial dilation. The Rosen wire was directed to the inferior vena cava. The sheath was positioned in the right atrium and right atrial pressures were measured as a mean of 10 mmHg. Through the indwelling TIPS sheath, a 5 [place] MPA catheter was guided into the right hepatic vein. Limited venogram was performed to confirm patency. The Rosen wire was then inserted through the catheter over which the TIPS sheath was advanced. Catheter wire removed and the Argon ScorpionX introducer and needle were inserted. Adequate window was optimized with the ICE catheter which was used to guide a single pass from the right hepatic vein to the right portal vein. Intravascular location was confirmed with the injection of contrast. A Glidewire Advantage was then inserted and directed to the splenic vein. The needle was removed. A marking pigtail catheter was then inserted with the pigtail portion coiled in the portal confluence. Portal manometry was performed which demonstrated portal venous hypertension with mean pressure of 34 mmHg (portosystemic gradient of 24 mmHg). Simultaneous portal venogram and hepatic venogram was  performed for planning purposes and stent selection. The pigtail catheter was removed and an 8 mm x 4 Atheltis balloon was inserted to dilate the intra parenchymal track. Next, an 8-10 mm, 7+2 mm Viatorr stent graft was inserted and deployed. The pigtail catheter was reinserted and central and portal pressures were measured. The mean right atrial pressure after TIPS placement was 12 mmHg, portal venous pressure 28 mmHg (portosystemic gradient of 16 mmHg). Therefore, the pigtail catheter was exchanged for an 8 mm x 4 cm Athletis balloon and the entire tips stent was expanded 8 mm. The pigtail catheter was reinserted and central and portal pressures were  measured. The mean right atrial pressure after balloon expansion was 13 mmHg, portal venous pressure 22 mmHg (portosystemic gradient of 9 mmHg). Completion portal venogram was performed which demonstrated patency of the indwelling TIPS, no evidence of significant gastric varices. The catheters and sheaths were removed. Hemostasis was achieved with manual compression at the right neck and right groin puncture sites. The patient tolerated the procedure well was transferred to the postanesthesia care unit in stable condition. My associate, Dr. Richarda Overlie, assisted during this procedure. IMPRESSION: 1. Portal hypertension with portosystemic gradient of 24 mmHg prior to TIPS creation. 2. Technically successful intravascular ultrasound guided TIPS creation and placement of a 7 cm+2 cm, 8-10 mm Viatorr stent graft with post deployment balloon molding to 8 mm and reduction of portosystemic gradient to 9 mmHg. Marliss Coots, MD [facility]iology Electronically Signed   By: Marliss Coots M.D.   On: 01/27/2021 15:51     Scheduled Meds:  [id number]       [MAR Hold] furosemide  40 mg Oral Daily   And   [MAR Hold] spironolactone  100 mg Oral Daily   Continuous Infusions:  lactated ringers 10 mL/hr at 01/27/21 1144   lactated ringers Stopped (01/27/21 1514)     LOS: 1 day   Time spent: 39  Rhetta Mura, MD [facility] To contact the attending provider between 7A-7P or the covering provider during after hours 7P-7A, please log into the web site [email or web address] and access using universal [place] password for that web site. If you do not have the password, please call the hospital operator.  01/27/2021, 4:17 PM

## 2021-01-27 NOTE — Anesthesia Preprocedure Evaluation (Addendum)
Anesthesia Evaluation  Patient identified by MRN, date of birth, ID band Patient awake    Reviewed: Allergy & Precautions, NPO status , Patient's Chart, lab work & pertinent test results  History of Anesthesia Complications Negative for: history of anesthetic complications  Airway Mallampati: II  TM Distance: >3 FB Neck ROM: Full    Dental  (+) Dental Advisory Given   Pulmonary Current Smoker,    Pulmonary exam normal        Cardiovascular negative cardio ROS Normal cardiovascular exam  Echo 01/14/21: EF 65-70%, g1dd, normal RV function, normal valves   Neuro/Psych negative neurological ROS     GI/Hepatic negative GI ROS, (+) Cirrhosis   ascites    ,   Endo/Other  Morbid obesity  Renal/GU Renal disease  negative genitourinary   Musculoskeletal negative musculoskeletal ROS (+)   Abdominal   Peds  Hematology negative hematology ROS (+) Thrombocytopenia- plts 68 INR 1.3   Anesthesia Other Findings   Reproductive/Obstetrics                            Anesthesia Physical Anesthesia Plan  ASA: 4  Anesthesia Plan: General   Post-op Pain Management:    Induction: Intravenous  PONV Risk Score and Plan: 2 and Ondansetron, Dexamethasone, Treatment may vary due to age or medical condition and Midazolam  Airway Management Planned: Oral ETT  Additional Equipment: Arterial line  Intra-op Plan:   Post-operative Plan: Extubation in OR  Informed Consent: I have reviewed the patients History and Physical, chart, labs and discussed the procedure including the risks, benefits and alternatives for the proposed anesthesia with the patient or authorized representative who has indicated his/her understanding and acceptance.     Dental advisory given  Plan Discussed with:   Anesthesia Plan Comments:        Anesthesia Quick Evaluation

## 2021-01-27 NOTE — Procedures (Signed)
Interventional Radiology Procedure Note  Procedure: TIPS creation  Findings: Please refer to procedural dictation for full description.  Portal hypertension with pre-TIPS portosystemic mean gradient of 24 (RA = 10, PV = 34).  Placemen of 8-10 mm, 7+2 cm Viatorr with balloon molding to 8 mm.  Final portosystemic mean gradient decreased to 9 mmHg (RA 13, PV 22).  Complications: None immediate  Estimated Blood Loss: 10 mL  Recommendations: No head of bed restrictions. Begin lactulose, titrate to 4-5 bowel movements per day. Continue current diuretic regimen for now.  Expect autodiuresis to ensue over next several days to weeks, with ultimate decreased diuretic requirement. Recommend MELD labs in am to include CBC, CMP, INR. IR will follow.    Marliss Coots, MD Pager: [phone]

## 2021-01-27 NOTE — Anesthesia Procedure Notes (Signed)
Arterial Line Insertion Start/End8/31/2022 12:30 PM Performed by: Audie Pinto, CRNA  Patient location: Pre-op. Preanesthetic checklist: patient identified, IV checked, risks and benefits discussed and surgical consent Lidocaine 1% used for infiltration Left, radial was placed Catheter size: 20 G Hand hygiene performed  and maximum sterile barriers used  Allen's test indicative of satisfactory collateral circulation Procedure performed without using ultrasound guided technique. Following insertion, dressing applied and Biopatch. Post procedure assessment: unchanged  Patient tolerated the procedure well with no immediate complications.

## 2021-01-27 NOTE — Transfer of Care (Signed)
Immediate Anesthesia Transfer of Care Note  Patient: Joan Roman  Procedure(s) Performed: IR WITH ANESTHESIA - TIPS  Patient Location: PACU  Anesthesia Type:General  Level of Consciousness: drowsy and patient cooperative  Airway & Oxygen Therapy: Patient Spontanous Breathing and Patient connected to face mask oxygen  Post-op Assessment: Report given to RN and Post -op Vital signs reviewed and stable  Post vital signs: Reviewed  Last Vitals:  Vitals Value Taken Time  BP 103/79 01/27/21 1550  Temp    Pulse 79 01/27/21 1554  Resp 12 01/27/21 1554  SpO2 99 % 01/27/21 1554  Vitals shown include unvalidated device data.  Last Pain:  Vitals:   01/27/21 1129  TempSrc:   PainSc: 0-No pain         Complications: No notable events documented.

## 2021-01-27 NOTE — Consult Note (Addendum)
[facility] Consult: 9:23 AM 01/28/2021  LOS: 2 days    Referring Provider: Dr Verlon Au  Primary Care Physician:  none, no PCP visits in greater than 10 years.   Primary Gastroenterologist:  Dr Jonathon Bellows, Dr Lucilla Lame.    Reason for Consultation: Cirrhosis, ascites.   HPI: Joan Roman is a 70 y.o. female.  PMH obesity.  BMI 42.  No regular medical care.  New diagnosis of cirrhosis and ascites during [facility] admission of 8/16 -01/25/21.   Presented for that admission with 6 weeks of abdominal and lower extremity swelling, weight up to 200 #.   01/12/2021 CTAP w/o contrast showed cirrhotic liver, portal hypertension, large abdominal pelvic ascites.  Trace right pleural effusion.  Gallstones.  Nonobstructing kidney stones. 01/14/21 echocardiogram: LVEF 65 to 70%.  Grade 1 diastolic dysfunction.  No significant valvular disease. 01/25/2021 abdominal ultrasound for eval of ascites: Trace ascites and no appropriate window for paracentesis. Platelets in the mid 86s.  Hgb 12.5.  T bili 1.8, alk phos 35, AST/ALT 19/6 Has undergone paracentesis on 4 occasions 8/17 through 8/26 with removal of anywhere from 3.5 to 6.8 L.  Fluid studies x 2 negative for SBP.  GI initiated Aldactone 100 mg daily and added IV lasix after hypokalemia resolved.  Planned outpatient EGD for esophageal surveillance.  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  Less than 2 g sodium diet daily prescribed.  Urine grew Klebsiella.  Diarrhea noted while taking antibiotics for UTI but resolving.  Hypotension addressed with midodrine.  Started folic acid and K44 for folate deficiency, no B12 deficiency.  Iron 37, low TIBC. Additonal labs:  Na 133, nadir 132.  Creatinine high of 1.6 this AM.  BUN high of 26 this morning.  GFR  34. Hgb 12.5.  Platelets 68K.  INR 1.2.  Ammonia 21.   Hepatitis B surface Ag, hepatitis B core total Ab, hepatitis A IgM, HCV Ab all nonreactive  Unfortunately pt continues to reaccumulate ascitic fluid despite diuretics and diuretic therapy hampered by increased creatinine.  Dr. Allen Norris felt TIPS indicated.  Pt transferred overnight for IR evaluation and hopefully TIPS, which she underwent yesterday, 8/31.  In July stopped smoking cigarettes of 10 to 20 cigarettes daily.  Rare alcohol.  Last alcohol drink was 04/2020 and no history of any heavy use.  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  Today she feels well overall.  Had some coughing issue this morning.  Wants to eat and is looking at the menu to order some lunch.    Past Medical History:  Diagnosis Date   Bronchitis     Past Surgical History:  Procedure Laterality Date   IR TIPS  01/27/2021   RADIOLOGY WITH ANESTHESIA N/A 01/27/2021   Procedure: IR WITH ANESTHESIA - TIPS;  Surgeon: Suzette Battiest, MD;  Location: [facility];  Service: Radiology;  Laterality: N/A;   TONSILLECTOMY      Prior to Admission medications   Not on File    Scheduled Meds:  spironolactone  50 mg Oral  Daily   And   furosemide  40 mg Oral Daily   midodrine  5 mg Oral TID WC   Infusions:   PRN Meds: ondansetron **OR** ondansetron (ZOFRAN) IV, oxyCODONE, polyethylene glycol   Allergies as of 01/25/2021   (No Known Allergies)    Family History  Problem Relation Age of Onset   Liver disease Neg Hx     Social History   Socioeconomic History   Marital status: Divorced    Spouse name: Not on file   Number of children: Not on file   Years of education: Not on file   Highest education level: Not on file  Occupational History   Not on file  Tobacco Use   Smoking status: Every Day    Types: Cigarettes   Smokeless tobacco: Never  Substance and Sexual Activity   Alcohol use: Not Currently   Drug use: Never   Sexual activity: Not  Currently  Other Topics Concern   Not on file  Social History Narrative   Not on file   Social Determinants of Health   Financial Resource Strain: Not on file  Food Insecurity: Not on file  Transportation Needs: Not on file  Physical Activity: Not on file  Stress: Not on file  Social Connections: Not on file  Intimate Partner Violence: Not on file    REVIEW OF SYSTEMS: Negative except as mentioned in HPI.  PHYSICAL EXAM: Vital signs in last 24 hours: Vitals:   01/28/21 0401 01/28/21 0724  BP: 101/64 (!) 98/56  Pulse: 66 65  Resp: 18 17  Temp: 97.7 F (36.5 C) 97.7 F (36.5 C)  SpO2: 99% 98%   Wt Readings from Last 3 Encounters:  01/27/21 111.6 kg  01/12/21 111.6 kg  09/13/19 117.9 kg   General:  Alert, chronically ill-appearing, pleasant and cooperative in NAD Head:  Normocephalic and atraumatic. Eyes:  Sclera clear, no icterus.  Conjunctiva pink. Ears:  Normal auditory acuity. Mouth:  No deformity or lesions.   Lungs:  Clear throughout to auscultation.  No wheezes, crackles, or rhonchi.  Heart:  Regular rate and rhythm; no murmurs, clicks, rubs, or gallops. Abdomen:  Softly distended with ascites fluid.  Non-tender. Msk:  Symmetrical without gross deformities. Pulses:  Normal pulses noted. Extremities:  Without clubbing or edema. Neurologic:  Alert and oriented x 4;  grossly normal neurologically. Skin:  Intact without significant lesions or rashes. Psych:  Alert and cooperative. Normal mood and affect.  Intake/Output from previous day: 08/31 0701 - 09/01 0700 In: 1340 [P.O.:240; I.V.:1000; IV Piggyback:100] Out: 520 [Urine:500; Blood:20]  LAB RESULTS: Recent Labs    01/27/21 0418 01/28/21 0219  WBC 3.2* 4.4  HGB 12.5 13.0  HCT 38.6 39.2  PLT 68* 59*   BMET Lab Results  Component Value Date   NA 134 (L) 01/28/2021   NA 133 (L) 01/27/2021   NA 134 (L) 01/25/2021   K 5.1 01/28/2021   K 4.3 01/27/2021   K 4.1 01/25/2021   CL 97 (L) 01/28/2021    CL 95 (L) 01/27/2021   CL 96 (L) 01/25/2021   CO2 27 01/28/2021   CO2 31 01/27/2021   CO2 30 01/25/2021   GLUCOSE 131 (H) 01/28/2021   GLUCOSE 94 01/27/2021   GLUCOSE 95 01/25/2021   BUN 24 (H) 01/28/2021   BUN 26 (H) 01/27/2021   BUN 21 01/25/2021   CREATININE 1.39 (H) 01/28/2021   CREATININE 1.65 (H) 01/27/2021   CREATININE 1.34 (H) 01/25/2021   CALCIUM  9.1 01/28/2021   CALCIUM 8.9 01/27/2021   CALCIUM 8.9 01/25/2021   LFT Recent Labs    01/27/21 0418 01/28/21 0219  PROT 6.2* 6.2*  ALBUMIN 2.9* 2.7*  AST 31 42*  ALT 14 17  ALKPHOS 47 48  BILITOT 1.1 1.0  BILIDIR 0.2  --   IBILI 0.9  --    PT/INR Lab Results  Component Value Date   INR 1.2 01/28/2021   INR 1.2 01/27/2021   INR 1.3 (H) 01/18/2021   RADIOLOGY STUDIES: IR Tips  Result Date: 01/27/2021 CLINICAL DATA:  70 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 EXAM: 1. Ultrasound-guided vascular access of the right internal jugular vein and right greater saphenous vein. 2. Intravascular ultrasound. 3. Central and portal venous manometry. 4. TIPS creation. MEDICATIONS: As antibiotic prophylaxis, 2 g Rocephin, intravenous was ordered pre-procedure and administered intravenously within one hour of incision. ANESTHESIA/SEDATION: General - as administered by the Anesthesia department CONTRAST:  74 mL Omnipaque 240, intravenous FLUOROSCOPY TIME:  Fluoroscopy Time: 29 minutes 0 seconds (758 mGy). COMPLICATIONS: None immediate. PROCEDURE: Informed written consent was obtained from the patient after a thorough discussion of the procedural risks, benefits and alternatives. All questions were addressed. Maximal Sterile Barrier Technique was utilized including caps, mask, sterile gowns, sterile gloves, sterile drape, hand hygiene and skin antiseptic. A timeout was performed prior to the initiation of the procedure. Preprocedure ultrasound evaluation demonstrated patency of the right greater saphenous vein.  A small skin nick was made at the planned needle entry site. Under direct ultrasound visualization, a 21 gauge micropuncture needle was directed into the right common femoral vein. A permanent image was captured and stored in the record. The micropuncture set was exchanged over a J wire for an 8 [place], 11 cm vascular sheath. Through the vascular sheath, an ICE catheter was directed under fluoroscopic and ultrasound guidance to the level of the perihepatic inferior vena cava. Evaluation of hepatic vasculature was performed which demonstrated a patent appearing and normal caliber portal vein, patent appearing right and middle hepatic veins. There was an optimal window from the right hepatic vein to the central right portal vein. Preprocedure ultrasound evaluation demonstrated patency of the right internal jugular vein. A small skin nick was made at the planned needle entry site. Under direct ultrasound visualization, a 21 gauge micropuncture needle was directed into the right internal jugular vein. A permanent image was captured and stored in the record. The micropuncture set was exchanged over a Rosen wire for a 10 French tips sheath after serial dilation. The Rosen wire was directed to the inferior vena cava. The sheath was positioned in the right atrium and right atrial pressures were measured as a mean of 10 mmHg. Through the indwelling TIPS sheath, a 5 [place] MPA catheter was guided into the right hepatic vein. Limited venogram was performed to confirm patency. The Rosen wire was then inserted through the catheter over which the TIPS sheath was advanced. Catheter wire removed and the Argon ScorpionX introducer and needle were inserted. Adequate window was optimized with the ICE catheter which was used to guide a single pass from the right hepatic vein to the right portal vein. Intravascular location was confirmed with the injection of contrast. A Glidewire Advantage was then inserted and directed to the splenic  vein. The needle was removed. A marking pigtail catheter was then inserted with the pigtail portion coiled in the portal confluence. Portal manometry was performed which demonstrated portal venous hypertension with mean pressure  of 34 mmHg (portosystemic gradient of 24 mmHg). Simultaneous portal venogram and hepatic venogram was performed for planning purposes and stent selection. The pigtail catheter was removed and an 8 mm x 4 Atheltis balloon was inserted to dilate the intra parenchymal track. Next, an 8-10 mm, 7+2 mm Viatorr stent graft was inserted and deployed. The pigtail catheter was reinserted and central and portal pressures were measured. The mean right atrial pressure after TIPS placement was 12 mmHg, portal venous pressure 28 mmHg (portosystemic gradient of 16 mmHg). Therefore, the pigtail catheter was exchanged for an 8 mm x 4 cm Athletis balloon and the entire tips stent was expanded 8 mm. The pigtail catheter was reinserted and central and portal pressures were measured. The mean right atrial pressure after balloon expansion was 13 mmHg, portal venous pressure 22 mmHg (portosystemic gradient of 9 mmHg). Completion portal venogram was performed which demonstrated patency of the indwelling TIPS, no evidence of significant gastric varices. The catheters and sheaths were removed. Hemostasis was achieved with manual compression at the right neck and right groin puncture sites. The patient tolerated the procedure well was transferred to the postanesthesia care unit in stable condition. My associate, Dr. Markus Daft, assisted during this procedure. IMPRESSION: 1. Portal hypertension with portosystemic gradient of 24 mmHg prior to TIPS creation. 2. Technically successful intravascular ultrasound guided TIPS creation and placement of a 7 cm+2 cm, 8-10 mm Viatorr stent graft with post deployment balloon molding to 8 mm and reduction of portosystemic gradient to 9 mmHg. Ruthann Cancer, MD [facility] Electronically Signed   By: Ruthann Cancer M.D.   On: 01/27/2021 15:51      IMPRESSION:   New diagnosis of cirrhosis of the liver decompensated with ascites and rising creatinine in setting of diuretics.  No significant history alcohol intake.  Viral hepatitis serologies negative.  Has not been worked up for autoimmune or metabolic causes of cirrhosis.  Ascites, recurrent despite multiple paracenteses over the past 1-2 weeks.  No evidence for SBP on fluid studies.  Management of ascites hampered by worsening renal function.  Transferred to [facility] hospital for TIPS, which she received on 8/31.  Pleural effusion.  Hepatic hydrothorax?   AKI, renal function improved some today.  Thrombocytopenia, platelets 59 today  Mild coagulopathy.  No excessive bleeding issues.  INR 1.2 today.    PLAN:     *Patient received her TIPS on 8/31. *Patient will follow-up as an outpatient for EGD for screening for esophageal varices.  Will also need extensive lab studies to rule out other causes of chronic liver disease.  This will all be done upon her follow-up with her GI through [place]. *2 gram sodium diet. *I have discontinued her diuretics.  Laban Emperor. Zehr  01/28/2021, 9:23 AM Phone [phone]  ________________________________________________________________________  [facility] MD note:  I personally examined the patient, reviewed the data and agree with the assessment and plan described above.  She underwent TIPS for refractory ascites yesterday and appears to have tolerated the procedure very well. TIPS resulted in a significant decrease in her portosystemic gradient from 24 to 59mHg. This should help her ascites quite well. No sign of hepatic decompensation clinically or by labs. We are stopping her diuretics. Would continue lactulose BID empirically until she is seen in [facility]  She will need follow up within  2-3 weeks and labs (cbc, cmet, INR) sometime early next week, sent to her  GI team at [facility]. OK to discharge tomorrow if still doing well clinically.  Low salt diet.  Please call or page with any further questions or concerns.  Owens Loffler, MD [facility] Pager [phone]

## 2021-01-27 NOTE — H&P (Addendum)
History and Physical    Joan Roman BZJ:[id number] DOB: 02/10/1951 DOA: 01/26/2021  PCP: Pcp, No  Patient coming from: Incoming transfer from [facility]   Chief Complaint: Abdominal swelling    HPI:    70 year old female with past medical history of recently diagnosed cirrhosis with persisting ascites requiring multiple large-volume paracenteses who presents as a transfer from [facility] for consideration of TIPS procedure.  Of note, patient was recently hospitalized at [facility] from 8/16 until 8/29.  Patient initially presented with a 1-1/65-month history of increasing abdominal girth.  Patient was diagnosed with cirrhosis of the liver and due to the sheer volume of her abdominal distention patient required frequent large-volume paracenteses on 8/17, 8/19, 8/23 and 8/26.  Patient was also found to have substantial thrombocytopenia but no evidence of bleeding.  Hospital course was also complicated by ongoing hypotension after spironolactone and Lasix were initiated.  Hypotension was addressed by initiation of midodrine.  Patient was regularly followed up on by gastroenterology while at [facility].  During that hospitalization gastroenterology was concerned about the patient being able to tolerate diuretics long-term considering her renal function and gastroenterology recommended transfer for consideration of TIPS procedure.  Per my discussion with patient, she is still complaining of abdominal pain, mostly on the right side and mild in intensity.  Pain is waxing and waning in quality and mild in intensity.  Patient explains that her abdominal pain has improved compared to several weeks ago but that is still present.  Patient denies associated peripheral edema, nausea, vomiting.  Recently complains of diarrhea, watery, occurring several times daily.  This is been ongoing for several days but seems to be gradually improving decreasing frequency of stooling and improving consistency of stools as well.     Due to perceived need for TIPS procedure patient has been transferred to [facility] medical floor for interventional radiology evaluation.  Review of Systems:   Review of Systems  Gastrointestinal:  Positive for abdominal pain and diarrhea.  Neurological:  Positive for weakness.  All other systems reviewed and are negative.  Past Medical History:  Diagnosis Date   Bronchitis     Past Surgical History:  Procedure Laterality Date   TONSILLECTOMY       reports that she has been smoking cigarettes. She has never used smokeless tobacco. She reports that she does not currently use alcohol. She reports that she does not use drugs.  No Known Allergies  Family History  Problem Relation Age of Onset   Liver disease Neg Hx      Prior to Admission medications   Not on File    Physical Exam: There were no vitals filed for this visit.  Constitutional: Awake alert and oriented x3, no associated distress.   Skin: no rashes, no lesions, good skin turgor noted. Eyes: Pupils are equally reactive to light.  No evidence of scleral icterus or conjunctival pallor.  ENMT: Moist mucous membranes noted.  Posterior pharynx clear of any exudate or lesions.   Neck: normal, supple, no masses, no thyromegaly.  No evidence of jugular venous distension.   Respiratory: clear to auscultation bilaterally, no wheezing, no crackles. Normal respiratory effort. No accessory muscle use.  Cardiovascular: Regular rate and rhythm, no murmurs / rubs / gallops. No extremity edema. 2+ pedal pulses. No carotid bruits.  Chest:   Nontender without crepitus or deformity.   Back:   Nontender without crepitus or deformity. Abdomen: Notable distention of the abdomen with fluid wave and shifting dullness.  Some mild tenderness of the right upper and right lower quadrants.  Abdomen is soft however.  No evidence of intra-abdominal masses.  Positive bowel sounds noted in all quadrants.   Musculoskeletal: No joint  deformity upper and lower extremities. Good ROM, no contractures. Normal muscle tone.  Neurologic: CN 2-12 grossly intact. Sensation intact.  Patient moving all 4 extremities spontaneously.  Patient is following all commands.  Patient is responsive to verbal stimuli.   Psychiatric: Patient exhibits normal mood with appropriate affect.  Patient seems to possess insight as to their current situation.     Labs on Admission: I have personally reviewed following labs and imaging studies -   CBC: Recent Labs  Lab 01/21/21 0456 01/22/21 0400 01/23/21 0541 01/24/21 0504 01/25/21 0429  WBC 3.4* 3.6* 3.2* 3.5* 3.8*  HGB 12.8 12.2 13.1 12.8 12.3  HCT 39.0 37.8 40.3 39.6 37.3  MCV 81.1 79.2* 79.2* 80.3 80.4  PLT 67* 64* 59* 62* 69*   Basic Metabolic Panel: Recent Labs  Lab 01/20/21 0527 01/21/21 0456 01/22/21 0400 01/23/21 0541 01/24/21 0504 01/25/21 0429  NA 134* 132* 133* 134* 136 134*  K 3.5 4.2 3.7 4.5 4.2 4.1  CL 92* 92* 95* 94* 97* 96*  CO2 33* 33* 33* 32 32 30  GLUCOSE 114* 98 85 91 94 95  BUN 18 18 20 19 21 21   CREATININE 1.10* 1.13* 1.05* 1.16* 1.27* 1.34*  CALCIUM 8.9 9.1 8.5* 9.1 8.9 8.9  MG 1.9 1.9 1.8  --   --   --    GFR: CrCl cannot be calculated (Unknown ideal weight.). Liver Function Tests: Recent Labs  Lab 01/21/21 0456 01/22/21 0400 01/23/21 0541 01/24/21 0504 01/25/21 0429  AST 29 29 37 38 37  ALT 11 12 13 14 14   ALKPHOS 39 39 44 41 48  BILITOT 1.4* 1.4* 1.4* 1.2 0.8  PROT 6.1* 5.6* 6.2* 5.7* 5.9*  ALBUMIN 3.3* 2.9* 3.3* 3.1* 2.9*   No results for input(s): LIPASE, AMYLASE in the last 168 hours. No results for input(s): AMMONIA in the last 168 hours. Coagulation Profile: No results for input(s): INR, PROTIME in the last 168 hours. Cardiac Enzymes: No results for input(s): CKTOTAL, CKMB, CKMBINDEX, TROPONINI in the last 168 hours. BNP (last 3 results) No results for input(s): PROBNP in the last 8760 hours. HbA1C: No results for input(s): HGBA1C  in the last 72 hours. CBG: Recent Labs  Lab 01/23/21 0726  GLUCAP 89   Lipid Profile: No results for input(s): CHOL, HDL, LDLCALC, TRIG, CHOLHDL, LDLDIRECT in the last 72 hours. Thyroid Function Tests: No results for input(s): TSH, T4TOTAL, FREET4, T3FREE, THYROIDAB in the last 72 hours. Anemia Panel: No results for input(s): VITAMINB12, FOLATE, FERRITIN, TIBC, IRON, RETICCTPCT in the last 72 hours. Urine analysis:    Component Value Date/Time   COLORURINE AMBER (A) 01/12/2021 2008   APPEARANCEUR CLOUDY (A) 01/12/2021 2008   LABSPEC 1.027 01/12/2021 2008   PHURINE 6.0 01/12/2021 2008   GLUCOSEU NEGATIVE 01/12/2021 2008   HGBUR MODERATE (A) 01/12/2021 2008   BILIRUBINUR SMALL (A) 01/12/2021 2008   KETONESUR 20 (A) 01/12/2021 2008   PROTEINUR 100 (A) 01/12/2021 2008   NITRITE NEGATIVE 01/12/2021 2008   LEUKOCYTESUR LARGE (A) 01/12/2021 2008    Radiological Exams on Admission - Personally Reviewed: 01/14/2021 Abdomen Limited  Result Date: 01/25/2021 CLINICAL DATA:  70 year old female with history of cirrhosis and recurrent ascites. EXAM: LIMITED ABDOMEN ULTRASOUND FOR ASCITES TECHNIQUE: Limited ultrasound survey for ascites was performed in all  four abdominal quadrants. COMPARISON:  01/22/2021 FINDINGS: Trace ascites in all 4 quadrants, most prominent in the right upper quadrant. No appropriate window for paracentesis at this time. IMPRESSION: Trace ascites, no appropriate window for paracentesis at this time. Marliss Coots, MD [facility] Electronically Signed   By: Marliss Coots M.D.   On: 01/25/2021 16:03     Assessment/Plan Principal Problem:   Cirrhosis of liver with ascites (HCC)  Diagnosed on presentation on 8/16 when patient presented to [facility] with 1/63-month history of increasing abdominal girth and lower extremity edema Patient is already undergone multiple large-volume paracenteses on 8/17, 8/19, 8/23 and 8/26. Will  resume spironolactone 100 mg daily and 40 mg of Lasix daily per previously prescribed regimen at [facility] as blood pressure tolerates. Due to recommended evaluation for TIPS by [facility] gastroenterology, an IR TIPS evaluation order has been placed.  Will await IR recommendations. Etiology of cirrhosis is still unclear.  Will ask GI here to help continue to evaluate.  Their assistance is appreciated.  Active Problems:   Thrombocytopenia (HCC)  Ongoing thrombocytopenia, felt to be secondary to patient's underlying ascites and portal hypertension No clinical evidence of bleeding Performing serial CBCs to monitor platelet counts    Nicotine dependence, cigarettes, uncomplicated  Patient quit on day of admission. Continuing to counsel cessation    Chronic diastolic CHF (congestive heart failure) (HCC)  Identified during this hospitalization based on echocardiography 8/18 Ongoing ascites blood otherwise systemically patient is volume depleted   Code Status:  Full code Family Communication: deferred   Status is: Inpatient  Remains inpatient appropriate because: Decompensated cirrhosis being evaluated for TIPS procedure.  Requiring consultation with multiple subspecialists, frequent procedures including large-volume paracenteses.  Dispo: The patient is from: Home              Anticipated d/c is to: Home              Patient currently is not medically stable to d/c.   Difficult to place patient Yes        Marinda Elk MD [facility] Pager [phone]  If 7PM-7AM, please contact night-coverage [email or web address] Use universal [place] password for that web site. If you do not have the password, please call the hospital operator.  01/27/2021, 2:10 AM

## 2021-01-27 NOTE — H&P (Signed)
Patient Status: [facility] - In-pt  Assessment and Plan: Cirrhosis with recurrent ascites.  Patient with recurrent, symptomatic ascites requiring frequent paracentesis. Recently admitted for rapid reaccumulation of fluid with concern for diuretic tolerance due to renal dysfunction.  During her admission at [facility] she was seen in consultation with Dr. Serafina Royals to discuss management at which time she was determined to be a candidate for TIPS.   Patient transferred to [facility] overnight.  She has been NPO.  MELD Na 18.   Creatinine up to 1.65 overnight.   Met with patient at bedside to discuss. She is agreeable to proceed today.   Risks and benefits of TIPS, BRTO and/or additional variceal embolization were discussed with the patient and/or the patient's family including, but not limited to, infection, bleeding, damage to adjacent structures, worsening hepatic and/or cardiac function, worsening and/or the development of altered mental status/encephalopathy, non-target embolization and death.   This interventional procedure involves the use of X-rays and because of the nature of the planned procedure, it is possible that we will have prolonged use of X-ray fluoroscopy.  Potential radiation risks to you include (but are not limited to) the following: - A slightly elevated risk for cancer  several years later in life. This risk is typically less than 0.5% percent. This risk is low in comparison to the normal incidence of human cancer, which is 33% for women and 50% for men according to the [place]. - Radiation induced injury can include skin redness, resembling a rash, tissue breakdown / ulcers and hair loss (which can be temporary or permanent).   The likelihood of either of these occurring depends on the difficulty of the procedure and whether you are sensitive to radiation due to previous procedures, disease, or genetic conditions.   IF your procedure requires a prolonged use of  radiation, you will be notified and given written instructions for further action.  It is your responsibility to monitor the irradiated area for the 2 weeks following the procedure and to notify your physician if you are concerned that you have suffered a radiation induced injury.    All of the patient's questions were answered, patient is agreeable to proceed.  Consent signed and in chart.   ______________________________________________________________________   History of Present Illness: Joan Roman is a 70 y.o. female with no significant past medical history recent admitted to [facility] with newly diagnosed decompensated cirrhosis with refractory ascites requiring multiple paracenteses during admission.  Also now noted to have worsening renal function with concern for diuretic tolerance.   Allergies and medications reviewed.   Review of Systems: A 12 point ROS discussed and pertinent positives are indicated in the HPI above.  All other systems are negative.  Review of Systems  Constitutional:  Negative for fatigue and fever.  Respiratory:  Negative for cough and shortness of breath.   Cardiovascular:  Negative for chest pain.  Gastrointestinal:  Negative for abdominal pain, diarrhea, nausea and vomiting.  Genitourinary:  Negative for dysuria.  Musculoskeletal:  Negative for back pain.  Psychiatric/Behavioral:  Negative for behavioral problems and confusion.    Vital Signs: BP 101/60 (BP Location: Right Arm)   Pulse 62   Temp 97.9 F (36.6 C)   Resp 17   SpO2 98%   Physical Exam Vitals and nursing note reviewed.  Constitutional:      General: She is not in acute distress.    Appearance: Normal appearance. She is not ill-appearing.  HENT:     Mouth/Throat:  Mouth: Mucous membranes are moist.     Pharynx: Oropharynx is clear.  Cardiovascular:     Rate and Rhythm: Normal rate and regular rhythm.  Pulmonary:     Effort: Pulmonary effort is normal.     Breath sounds:  Normal breath sounds.  Abdominal:     General: Abdomen is flat.     Palpations: Abdomen is soft.  Skin:    General: Skin is warm and dry.  Neurological:     General: No focal deficit present.     Mental Status: She is alert and oriented to person, place, and time. Mental status is at baseline.  Psychiatric:        Mood and Affect: Mood normal.        Behavior: Behavior normal.        Thought Content: Thought content normal.        Judgment: Judgment normal.     Imaging reviewed.   Labs:  COAGS: Recent Labs    01/18/21 0751 01/27/21 0418  INR 1.3* 1.2  APTT  --  31    BMP: Recent Labs    01/23/21 0541 01/24/21 0504 01/25/21 0429 01/27/21 0418  NA 134* 136 134* 133*  K 4.5 4.2 4.1 4.3  CL 94* 97* 96* 95*  CO2 32 32 30 31  GLUCOSE 91 94 95 94  BUN _0 26*  CALCIUM 9.1 8.9 8.9 8.9  CREATININE 1.16* 1.27* 1.34* 1.65*  GFRNONAA 51* 46* 43* 34*       Electronically Signed: Docia Barrier, PA 01/27/2021, 9:31 AM   I spent a total of 15 minutes in face to face in clinical consultation, greater than 50% of which was counseling/coordinating care for cirrhosis with recurrent ascites.

## 2021-01-27 NOTE — Hospital Course (Signed)
70 year old white female Recent diagnosis cirrhosis (etiology unclear hepatitis panel negative TSH, HIV nonreactive--?  EtOH/NASH-autoimmune work-up pending) with ascites requiring frequent large-volume paracentesis 8/17 8/19 8/23 8/26--intolerant diuretics secondary to hypotension started on midodrine--patient transferred to [facility] for TIPS procedure + Abdominal pain + right-sided pain + episodic diarrhea   Sodium 133, BUN/creatinine 26/1.6 LFTs benign Bilirubin 1.1 WBC 3.2 hemoglobin 12.5 platelets 68

## 2021-01-28 ENCOUNTER — Other Ambulatory Visit: Payer: Self-pay

## 2021-01-28 DIAGNOSIS — K7460 Unspecified cirrhosis of liver: Secondary | ICD-10-CM | POA: Diagnosis not present

## 2021-01-28 DIAGNOSIS — R188 Other ascites: Secondary | ICD-10-CM | POA: Diagnosis not present

## 2021-01-28 LAB — CBC WITH DIFFERENTIAL/PLATELET
Abs Immature Granulocytes: 0.01 10*3/uL (ref 0.00–0.07)
Basophils Absolute: 0 10*3/uL (ref 0.0–0.1)
Basophils Relative: 0 %
Eosinophils Absolute: 0 10*3/uL (ref 0.0–0.5)
Eosinophils Relative: 0 %
HCT: 39.2 % (ref 36.0–46.0)
Hemoglobin: 13 g/dL (ref 12.0–15.0)
Immature Granulocytes: 0 %
Lymphocytes Relative: 9 %
Lymphs Abs: 0.4 10*3/uL — ABNORMAL LOW (ref 0.7–4.0)
MCH: 26.1 pg (ref 26.0–34.0)
MCHC: 33.2 g/dL (ref 30.0–36.0)
MCV: 78.6 fL — ABNORMAL LOW (ref 80.0–100.0)
Monocytes Absolute: 0.2 10*3/uL (ref 0.1–1.0)
Monocytes Relative: 5 %
Neutro Abs: 3.8 10*3/uL (ref 1.7–7.7)
Neutrophils Relative %: 86 %
Platelets: 59 10*3/uL — ABNORMAL LOW (ref 150–400)
RBC: 4.99 MIL/uL (ref 3.87–5.11)
RDW: 17.4 % — ABNORMAL HIGH (ref 11.5–15.5)
WBC: 4.4 10*3/uL (ref 4.0–10.5)
nRBC: 0 % (ref 0.0–0.2)

## 2021-01-28 LAB — COMPREHENSIVE METABOLIC PANEL
ALT: 17 U/L (ref 0–44)
AST: 42 U/L — ABNORMAL HIGH (ref 15–41)
Albumin: 2.7 g/dL — ABNORMAL LOW (ref 3.5–5.0)
Alkaline Phosphatase: 48 U/L (ref 38–126)
Anion gap: 10 (ref 5–15)
BUN: 24 mg/dL — ABNORMAL HIGH (ref 8–23)
CO2: 27 mmol/L (ref 22–32)
Calcium: 9.1 mg/dL (ref 8.9–10.3)
Chloride: 97 mmol/L — ABNORMAL LOW (ref 98–111)
Creatinine, Ser: 1.39 mg/dL — ABNORMAL HIGH (ref 0.44–1.00)
GFR, Estimated: 41 mL/min — ABNORMAL LOW (ref >60)
Glucose, Bld: 131 mg/dL — ABNORMAL HIGH (ref 70–99)
Potassium: 5.1 mmol/L (ref 3.5–5.1)
Sodium: 134 mmol/L — ABNORMAL LOW (ref 135–145)
Total Bilirubin: 1 mg/dL (ref 0.3–1.2)
Total Protein: 6.2 g/dL — ABNORMAL LOW (ref 6.5–8.1)

## 2021-01-28 LAB — PROTIME-INR
INR: 1.2 (ref 0.8–1.2)
Prothrombin Time: 15.6 seconds — ABNORMAL HIGH (ref 11.4–15.2)

## 2021-01-28 MED ORDER — FUROSEMIDE 40 MG PO TABS
40.0000 mg | ORAL_TABLET | Freq: Every day | ORAL | Status: DC
  Filled 2021-01-28: qty 1

## 2021-01-28 MED ORDER — LACTULOSE 10 GM/15ML PO SOLN
20.0000 g | Freq: Three times a day (TID) | ORAL | 3 refills | Status: DC
  Filled 2021-01-28: qty 8100, 90d supply, fill #0

## 2021-01-28 MED ORDER — LACTULOSE 10 GM/15ML PO SOLN
20.0000 g | Freq: Three times a day (TID) | ORAL | Status: DC
  Administered 2021-01-28 – 2021-01-29 (×3): 20 g via ORAL
  Filled 2021-01-28 (×3): qty 30

## 2021-01-28 MED ORDER — MIDODRINE HCL 5 MG PO TABS
5.0000 mg | ORAL_TABLET | Freq: Three times a day (TID) | ORAL | Status: DC
  Administered 2021-01-28 – 2021-01-29 (×4): 5 mg via ORAL
  Filled 2021-01-28 (×4): qty 1

## 2021-01-28 MED ORDER — SPIRONOLACTONE 25 MG PO TABS
50.0000 mg | ORAL_TABLET | Freq: Every day | ORAL | Status: DC
  Filled 2021-01-28: qty 2

## 2021-01-28 MED ORDER — LACTULOSE 10 GM/15ML PO SOLN: 20.0000 g | Freq: Two times a day (BID) | ORAL | Status: DC

## 2021-01-28 NOTE — Progress Notes (Signed)
Referring Physician(s): Shauna Hugh, MD  Supervising Physician: Marliss Coots  Patient Status:  [facility] - In-pt  Chief Complaint: Follow up TIPS placement 01/27/21 in IR  Subjective:  Joan Roman is feeling well overall, she noted some light headedness when got up to use bathroom earlier but felt fine otherwise. She denies any confusion. She noted her last 2 BMs to be more hard and small than usual. She also notes her urine to be dark clear yellow. She endorses right shoulder pain which is not new as well as some tenderness at the right neck puncture site. She denies any tenderness at the right groin puncture site.  Allergies: Patient has no known allergies.  Medications: Prior to Admission medications   Medication Sig Start Date End Date Taking? Authorizing Provider  lactulose (CHRONULAC) 10 GM/15ML solution Take 30 mLs (20 g total) by mouth 3 (three) times daily. 01/28/21 01/23/22 Yes Lynnette Caffey A, PA-C     Vital Signs: BP (!) 98/56 (BP Location: Right Arm)   Pulse 65   Temp 97.7 F (36.5 C) (Oral)   Resp 17   Ht 5\' 4"  (1.626 m)   Wt 246 lb 0.5 oz (111.6 kg)   SpO2 98%   BMI 42.23 kg/m   Physical Exam Vitals and nursing note reviewed.  Constitutional:      General: She is not in acute distress. HENT:     Head: Normocephalic.  Eyes:     General: No scleral icterus. Cardiovascular:     Rate and Rhythm: Normal rate.     Comments: (+) Right IJ puncture site clean, dry, dressed appropriately without active bleeding or drainage. Appropriately tender to palpation. (+) Right greater saphenous vein puncture site clean, dry, dressed appropriately without active bleeding or drainage. Non tender to palpation. Pulmonary:     Effort: Pulmonary effort is normal.  Abdominal:     General: There is no distension.     Palpations: Abdomen is soft.     Tenderness: There is no abdominal tenderness.  Skin:    General: Skin is warm and dry.     Coloration: Skin is not  jaundiced.  Neurological:     Mental Status: She is alert. Mental status is at baseline.  Psychiatric:        Mood and Affect: Mood normal.        Behavior: Behavior normal.        Thought Content: Thought content normal.        Judgment: Judgment normal.    Imaging: Abdomen Limited  Result Date: 01/25/2021 CLINICAL DATA:  70 year old female with history of cirrhosis and recurrent ascites. EXAM: LIMITED ABDOMEN ULTRASOUND FOR ASCITES TECHNIQUE: Limited ultrasound survey for ascites was performed in all four abdominal quadrants. COMPARISON:  01/22/2021 FINDINGS: Trace ascites in all 4 quadrants, most prominent in the right upper quadrant. No appropriate window for paracentesis at this time. IMPRESSION: Trace ascites, no appropriate window for paracentesis at this time. 01/24/2021, MD [facility] Electronically Signed   By: [facility] M.D.   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 EXAM: 1. Ultrasound-guided vascular access of the right internal jugular vein and right greater saphenous vein. 2. Intravascular ultrasound. 3. Central and portal venous manometry. 4. TIPS creation. MEDICATIONS: As antibiotic prophylaxis, 2 g Rocephin, intravenous was ordered pre-procedure and administered intravenously within one hour of incision.  ANESTHESIA/SEDATION: General - as administered by the Anesthesia department CONTRAST:  74 mL Omnipaque 240, intravenous FLUOROSCOPY TIME:  Fluoroscopy Time: 29 minutes 0 seconds (758 mGy). COMPLICATIONS: None immediate. PROCEDURE: Informed written consent was obtained from the patient after a thorough discussion of the procedural risks, benefits and alternatives. All questions were addressed. Maximal Sterile Barrier Technique was utilized including caps, mask, sterile gowns, sterile gloves, sterile drape, hand  hygiene and skin antiseptic. A timeout was performed prior to the initiation of the procedure. Preprocedure ultrasound evaluation demonstrated patency of the right greater saphenous vein. A small skin nick was made at the planned needle entry site. Under direct ultrasound visualization, a 21 gauge micropuncture needle was directed into the right common femoral vein. A permanent image was captured and stored in the record. The micropuncture set was exchanged over a J wire for an 8 [place], 11 cm vascular sheath. Through the vascular sheath, an ICE catheter was directed under fluoroscopic and ultrasound guidance to the level of the perihepatic inferior vena cava. Evaluation of hepatic vasculature was performed which demonstrated a patent appearing and normal caliber portal vein, patent appearing right and middle hepatic veins. There was an optimal window from the right hepatic vein to the central right portal vein. Preprocedure ultrasound evaluation demonstrated patency of the right internal jugular vein. A small skin nick was made at the planned needle entry site. Under direct ultrasound visualization, a 21 gauge micropuncture needle was directed into the right internal jugular vein. A permanent image was captured and stored in the record. The micropuncture set was exchanged over a Rosen wire for a 10 French tips sheath after serial dilation. The Rosen wire was directed to the inferior vena cava. The sheath was positioned in the right atrium and right atrial pressures were measured as a mean of 10 mmHg. Through the indwelling TIPS sheath, a 5 [place] MPA catheter was guided into the right hepatic vein. Limited venogram was performed to confirm patency. The Rosen wire was then inserted through the catheter over which the TIPS sheath was advanced. Catheter wire removed and the Argon ScorpionX introducer and needle were inserted. Adequate window was optimized with the ICE catheter which was used to guide a single pass  from the right hepatic vein to the right portal vein. Intravascular location was confirmed with the injection of contrast. A Glidewire Advantage was then inserted and directed to the splenic vein. The needle was removed. A marking pigtail catheter was then inserted with the pigtail portion coiled in the portal confluence. Portal manometry was performed which demonstrated portal venous hypertension with mean pressure of 34 mmHg (portosystemic gradient of 24 mmHg). Simultaneous portal venogram and hepatic venogram was performed for planning purposes and stent selection. The pigtail catheter was removed and an 8 mm x 4 Atheltis balloon was inserted to dilate the intra parenchymal track. Next, an 8-10 mm, 7+2 mm Viatorr stent graft was inserted and deployed. The pigtail catheter was reinserted and central and portal pressures were measured. The mean right atrial pressure after TIPS placement was 12 mmHg, portal venous pressure 28 mmHg (portosystemic gradient of 16 mmHg). Therefore, the pigtail catheter was exchanged for an 8 mm x 4 cm Athletis balloon and the entire tips stent was expanded 8 mm. The pigtail catheter was reinserted and central and portal pressures were measured. The mean right atrial pressure after balloon expansion was 13 mmHg, portal venous pressure 22 mmHg (portosystemic gradient of 9 mmHg). Completion portal venogram was performed which demonstrated patency of  the indwelling TIPS, no evidence of significant gastric varices. The catheters and sheaths were removed. Hemostasis was achieved with manual compression at the right neck and right groin puncture sites. The patient tolerated the procedure well was transferred to the postanesthesia care unit in stable condition. My associate, Dr. Richarda Overlie, assisted during this procedure. IMPRESSION: 1. Portal hypertension with portosystemic gradient of 24 mmHg prior to TIPS creation. 2. Technically successful intravascular ultrasound guided TIPS creation and  placement of a 7 cm+2 cm, 8-10 mm Viatorr stent graft with post deployment balloon molding to 8 mm and reduction of portosystemic gradient to 9 mmHg. Marliss Coots, MD [facility] Radiology Electronically Signed   By: Marliss Coots M.D.   On: 01/27/2021 15:51    Labs:  CBC: Recent Labs    01/24/21 0504 01/25/21 0429 01/27/21 0418 01/28/21 0219  WBC 3.5* 3.8* 3.2* 4.4  HGB 12.8 12.3 12.5 13.0  HCT 39.6 37.3 38.6 39.2  PLT 62* 69* 68* 59*    COAGS: Recent Labs    01/18/21 0751 01/27/21 0418 01/28/21 0219  INR 1.3* 1.2 1.2  APTT  --  31  --     BMP: Recent Labs    01/24/21 0504 01/25/21 0429 01/27/21 0418 01/28/21 0219  NA 136 134* 133* 134*  K 4.2 4.1 4.3 5.1  CL 97* 96* 95* 97*  CO2 32 30 31 27   GLUCOSE 94 95 94 131*  BUN 21 21 26* 24*  CALCIUM 8.9 8.9 8.9 9.1  CREATININE 1.27* 1.34* 1.65* 1.39*  GFRNONAA 46* 43* 34* 41*    LIVER FUNCTION TESTS: Recent Labs    01/24/21 0504 01/25/21 0429 01/27/21 0418 01/28/21 0219  BILITOT 1.2 0.8 1.1 1.0  AST 38 37 31 42*  ALT 14 14 14 17   ALKPHOS 41 48 47 48  PROT 5.7* 5.9* 6.2* 6.2*  ALBUMIN 3.1* 2.9* 2.9* 2.7*    Assessment and Plan:  70 y/o F s/p TIPS placement 8/31 in IR with Dr. 73 seen today for follow up.  Patient feels well overall, looking forward to advancing from liquid diet. She has some soreness at right IJ puncture site which is expected. Today's labs reviewed and noted to be unremarkable.  Plan: - Begin lactulose 20 g TID, titrate to 3-4 soft BMs QD. Discussed with patient that she will need to continue to take this medication after leaving the hospital and to call our office if she is not having at least 3-4 soft BMs QD. eRx sent to pharmacy today. - Follow up in IR clinic in approximately 4 weeks for TIPS doppler 9/31 and appointment with Dr. Elby Showers. IR scheduler will call patient with appointment date/time, I have placed our contact information in  the discharge summary and she has also been given Dr. [place] card today. - Keep right IJ and right greater saphenous vein puncture sites clean, dry and dressed until healed. Do not submerge x 7 days. - Discharge per primary team, IR remains available as needed  Please call IR with any questions or concerns.  Electronically Signed: Elby Showers, PA-C 01/28/2021, 2:51 PM   I spent a total of 15 Minutes at the the patient's bedside AND on the patient's hospital floor or unit, greater than 50% of which was counseling/coordinating care for TIPS follow up.

## 2021-01-28 NOTE — Plan of Care (Signed)

## 2021-01-28 NOTE — Progress Notes (Signed)
PROGRESS NOTE   RANDALL RAMPERSAD  IDP:[id number] DOB: 25-Feb-1951 DOA: 01/26/2021 PCP: Pcp, No  Brief Narrative:   70 year old white female Recent diagnosis cirrhosis (etiology unclear hepatitis panel negative TSH, HIV nonreactive--?  EtOH/NASH-autoimmune work-up pending) with ascites requiring frequent large-volume paracentesis 8/17 8/19 8/23 8/26--intolerant diuretics secondary to hypotension started on midodrine--patient transferred to [facility] for TIPS procedure + Abdominal pain + right-sided pain + episodic diarrhea  Hospital-Problem based course  Decompensated cryptogenic cirrhosis, Meld-Na 12 ~ 50% mortality risk Autoimmune work-up apparently in process in the outpatient setting  Platelets 60-50 and stable Status post TIPS procedure Dr. Elby Showers 8/31 2022 Await formal GI input--currently aldactone 50, Lasix 40 added low-dose lactulose 20 twice daily--- propranolol held secondary to bradycardia- Resumed on midodrine at 5 mg tid and uptitrate if MAP below 70 Undifferentiated diarrhea Low risk for C. difficile-monitor trends Hyperkalemia Possibly secondary to Aldactone--cut back the dose 100--> 50 mg on 9/1 ATN superimposed on CKD 1 Labs are improve don adjustment of diuretics HFpEF based on echo 8/18 Diuretics as above   DVT prophylaxis: SCD Code Status: Full Family Communication: None present patient seen in PACU Disposition:  Status is: Inpatient  Remains inpatient appropriate because:Hemodynamically unstable, Persistent severe electrolyte disturbances, and Unsafe d/c plan  Dispo: The patient is from: Home              Anticipated d/c is to: SNF              Patient currently is not medically stable to d/c.   Difficult to place patient No  Consultants:  Interventional radiology  Procedures: TIPS procedure 8/31  Antimicrobials: Perioperative   Subjective: Awake coherent lucid no distress on oxygen Had a little bit of cough earlier today with ambulation but no chest  pain no fever no sputum Tolerating some diet  Objective: Vitals:   01/27/21 1929 01/28/21 0044 01/28/21 0401 01/28/21 0724  BP: 108/67 111/67 101/64 (!) 98/56  Pulse: 78 84 66 65  Resp: 16 16 18 17   Temp:  97.6 F (36.4 C) 97.7 F (36.5 C) 97.7 F (36.5 C)  TempSrc:  Oral Oral Oral  SpO2: 97% 98% 99% 98%  Weight:      Height:        Intake/Output Summary (Last 24 hours) at 01/28/2021 1057 Last data filed at 01/28/2021 0030 Gross per 24 hour  Intake 1340 ml  Output 520 ml  Net 820 ml    Filed Weights   01/27/21 1122  Weight: 111.6 kg    Examination:  EOMI NCAT no focal deficit no tremor on oxygen Neck soft supple CTA B no rales no rhonchi Abdomen soft obese nontender no rebound no guarding bowel sounds appreciated cannot appreciate liver or spleen ROM intact Power 5/5 no asterixis no tremor no flap   Data Reviewed: personally reviewed   CBC    Component Value Date/Time   WBC 4.4 01/28/2021 0219   RBC 4.99 01/28/2021 0219   HGB 13.0 01/28/2021 0219   HCT 39.2 01/28/2021 0219   PLT 59 (L) 01/28/2021 0219   MCV 78.6 (L) 01/28/2021 0219   MCH 26.1 01/28/2021 0219   MCHC 33.2 01/28/2021 0219   RDW 17.4 (H) 01/28/2021 0219   LYMPHSABS 0.4 (L) 01/28/2021 0219   MONOABS 0.2 01/28/2021 0219   EOSABS 0.0 01/28/2021 0219   BASOSABS 0.0 01/28/2021 0219   CMP Latest Ref Rng & Units 01/28/2021 01/27/2021 01/25/2021  Glucose 70 - 99 mg/dL 01/27/2021) 94 95  BUN 8 -  23 mg/dL 50(P) 54(S) 21  Creatinine 0.44 - 1.00 mg/dL 5.68(L) 2.75(T) 7.00(F)  Sodium 135 - 145 mmol/L 134(L) 133(L) 134(L)  Potassium 3.5 - 5.1 mmol/L 5.1 4.3 4.1  Chloride 98 - 111 mmol/L 97(L) 95(L) 96(L)  CO2 22 - 32 mmol/L 27 31 30   Calcium 8.9 - 10.3 mg/dL 9.1 8.9 8.9  Total Protein 6.5 - 8.1 g/dL 6.2(L) 6.2(L) 5.9(L)  Total Bilirubin 0.3 - 1.2 mg/dL 1.0 1.1 0.8  Alkaline Phos 38 - 126 U/L 48 47 48  AST 15 - 41 U/L 42(H) 31 37  ALT 0 - 44 U/L 17 14 14      Radiology Studies: IR Tips  Result Date:  01/27/2021 CLINICAL DATA:  70 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 EXAM: 1. Ultrasound-guided vascular access of the right internal jugular vein and right greater saphenous vein. 2. Intravascular ultrasound. 3. Central and portal venous manometry. 4. TIPS creation. MEDICATIONS: As antibiotic prophylaxis, 2 g Rocephin, intravenous was ordered pre-procedure and administered intravenously within one hour of incision. ANESTHESIA/SEDATION: General - as administered by the Anesthesia department CONTRAST:  74 mL Omnipaque 240, intravenous FLUOROSCOPY TIME:  Fluoroscopy Time: 29 minutes 0 seconds (758 mGy). COMPLICATIONS: None immediate. PROCEDURE: Informed written consent was obtained from the patient after a thorough discussion of the procedural risks, benefits and alternatives. All questions were addressed. Maximal Sterile Barrier Technique was utilized including caps, mask, sterile gowns, sterile gloves, sterile drape, hand hygiene and skin antiseptic. A timeout was performed prior to the initiation of the procedure. Preprocedure ultrasound evaluation demonstrated patency of the right greater saphenous vein. A small skin nick was made at the planned needle entry site. Under direct ultrasound visualization, a 21 gauge micropuncture needle was directed into the right common femoral vein. A permanent image was captured and stored in the record. The micropuncture set was exchanged over a J wire for an 8 01/29/2021, 11 cm vascular sheath. Through the vascular sheath, an ICE catheter was directed under fluoroscopic and ultrasound guidance to the level of the perihepatic inferior vena cava. Evaluation of hepatic vasculature was performed which demonstrated a patent appearing and normal caliber portal vein, patent appearing right and middle hepatic veins. There was an optimal window from the right hepatic vein to the central right portal vein. Preprocedure ultrasound evaluation demonstrated patency  of the right internal jugular vein. A small skin nick was made at the planned needle entry site. Under direct ultrasound visualization, a 21 gauge micropuncture needle was directed into the right internal jugular vein. A permanent image was captured and stored in the record. The micropuncture set was exchanged over a Rosen wire for a 10 French tips sheath after serial dilation. The Rosen wire was directed to the inferior vena cava. The sheath was positioned in the right atrium and right atrial pressures were measured as a mean of 10 mmHg. Through the indwelling TIPS sheath, a 5 73 MPA catheter was guided into the right hepatic vein. Limited venogram was performed to confirm patency. The Rosen wire was then inserted through the catheter over which the TIPS sheath was advanced. Catheter wire removed and the Argon ScorpionX introducer and needle were inserted. Adequate window was optimized with the ICE catheter which was used to guide a single pass from the right hepatic vein to the right portal vein. Intravascular location was confirmed with the injection of contrast. A Glidewire Advantage was then inserted and directed to the splenic vein. The needle was removed. A marking pigtail catheter  was then inserted with the pigtail portion coiled in the portal confluence. Portal manometry was performed which demonstrated portal venous hypertension with mean pressure of 34 mmHg (portosystemic gradient of 24 mmHg). Simultaneous portal venogram and hepatic venogram was performed for planning purposes and stent selection. The pigtail catheter was removed and an 8 mm x 4 Atheltis balloon was inserted to dilate the intra parenchymal track. Next, an 8-10 mm, 7+2 mm Viatorr stent graft was inserted and deployed. The pigtail catheter was reinserted and central and portal pressures were measured. The mean right atrial pressure after TIPS placement was 12 mmHg, portal venous pressure 28 mmHg (portosystemic gradient of 16 mmHg).  Therefore, the pigtail catheter was exchanged for an 8 mm x 4 cm Athletis balloon and the entire tips stent was expanded 8 mm. The pigtail catheter was reinserted and central and portal pressures were measured. The mean right atrial pressure after balloon expansion was 13 mmHg, portal venous pressure 22 mmHg (portosystemic gradient of 9 mmHg). Completion portal venogram was performed which demonstrated patency of the indwelling TIPS, no evidence of significant gastric varices. The catheters and sheaths were removed. Hemostasis was achieved with manual compression at the right neck and right groin puncture sites. The patient tolerated the procedure well was transferred to the postanesthesia care unit in stable condition. My associate, Dr. Richarda Overlie, assisted during this procedure. IMPRESSION: 1. Portal hypertension with portosystemic gradient of 24 mmHg prior to TIPS creation. 2. Technically successful intravascular ultrasound guided TIPS creation and placement of a 7 cm+2 cm, 8-10 mm Viatorr stent graft with post deployment balloon molding to 8 mm and reduction of portosystemic gradient to 9 mmHg. Marliss Coots, MD [facility] Radiology Electronically Signed   By: Marliss Coots M.D.   On: 01/27/2021 15:51     Scheduled Meds:  midodrine  5 mg Oral TID WC   Continuous Infusions:     LOS: 2 days   Time spent: 23  Rhetta Mura, MD [facility] To contact the attending provider between 7A-7P or the covering provider during after hours 7P-7A, please log into the web site [email or web address] and access using universal [place] password for that web site. If you do not have the password, please call the hospital operator.  01/28/2021, 10:57 AM

## 2021-01-28 NOTE — Anesthesia Postprocedure Evaluation (Signed)
Anesthesia Post Note  Patient: Joan Roman  Procedure(s) Performed: IR WITH ANESTHESIA - TIPS     Patient location during evaluation: PACU Anesthesia Type: General Level of consciousness: awake and alert Pain management: pain level controlled Vital Signs Assessment: post-procedure vital signs reviewed and stable Respiratory status: spontaneous breathing, nonlabored ventilation, respiratory function stable and patient connected to nasal cannula oxygen Cardiovascular status: blood pressure returned to baseline and stable Postop Assessment: no apparent nausea or vomiting Anesthetic complications: no   No notable events documented.  Last Vitals:  Vitals:   01/28/21 0401 01/28/21 0724  BP: 101/64 (!) 98/56  Pulse: 66 65  Resp: 18 17  Temp: 36.5 C 36.5 C  SpO2: 99% 98%    Last Pain:  Vitals:   01/28/21 0724  TempSrc: Oral  PainSc:                  Lucretia Kern

## 2021-01-29 ENCOUNTER — Other Ambulatory Visit: Payer: Self-pay

## 2021-01-29 DIAGNOSIS — K7460 Unspecified cirrhosis of liver: Secondary | ICD-10-CM | POA: Diagnosis not present

## 2021-01-29 DIAGNOSIS — R188 Other ascites: Secondary | ICD-10-CM | POA: Diagnosis not present

## 2021-01-29 LAB — COMPREHENSIVE METABOLIC PANEL
ALT: 23 U/L (ref 0–44)
AST: 51 U/L — ABNORMAL HIGH (ref 15–41)
Albumin: 2.8 g/dL — ABNORMAL LOW (ref 3.5–5.0)
Alkaline Phosphatase: 51 U/L (ref 38–126)
Anion gap: 7 (ref 5–15)
BUN: 23 mg/dL (ref 8–23)
CO2: 29 mmol/L (ref 22–32)
Calcium: 9.1 mg/dL (ref 8.9–10.3)
Chloride: 99 mmol/L (ref 98–111)
Creatinine, Ser: 1.6 mg/dL — ABNORMAL HIGH (ref 0.44–1.00)
GFR, Estimated: 35 mL/min — ABNORMAL LOW (ref >60)
Glucose, Bld: 94 mg/dL (ref 70–99)
Potassium: 4.4 mmol/L (ref 3.5–5.1)
Sodium: 135 mmol/L (ref 135–145)
Total Bilirubin: 1 mg/dL (ref 0.3–1.2)
Total Protein: 6 g/dL — ABNORMAL LOW (ref 6.5–8.1)

## 2021-01-29 MED ORDER — MIDODRINE HCL 10 MG PO TABS: 10.0000 mg | ORAL_TABLET | Freq: Three times a day (TID) | ORAL | 1 refills | Status: AC

## 2021-01-29 MED ORDER — LACTULOSE 10 GM/15ML PO SOLN: 20.0000 g | Freq: Three times a day (TID) | ORAL | 3 refills | Status: AC

## 2021-01-29 NOTE — Progress Notes (Signed)
Nursing DC note  Patient alert and oriented, verbalized understanding of dc instructions. Prescription and dc papers given to patient. VSS. Pivdcd , all sites unremarkable. No bleeding. Pressure dressing intact. Primary RN made aware patient awaiting for ride home and will be here around 1;30

## 2021-01-29 NOTE — Care Management Important Message (Signed)
Important Message  Patient Details  Name: Joan Roman MRN: [id number] Date of Birth: [birth year]   Medicare Important Message Given:  Yes     [name] Stefan Church 01/29/2021, 3:19 PM

## 2021-01-29 NOTE — Care Management (Signed)
Prior to admission patient was active with [facility] for PT,OT and aide. Resumption of care orders entered and Kenzie with [facility] aware discharge today

## 2021-01-29 NOTE — Discharge Summary (Signed)
Physician Discharge Summary  Joan Roman XBJ:[id number] DOB: [birth year] DOA: 01/26/2021  PCP: Pcp, No  Admit date: 01/26/2021 Discharge date: 01/29/2021  Time spent: 27 minutes  Recommendations for Outpatient Follow-up:  Will need Chem-12, CBC, INR in about 2 to 3 weeks and follow-up with Dr. Clydie Braun or Dr. Servando Snare of gastroenterology at [facility] regional Requires 4 weeks TIPS Doppler with interventional radiology-can follow-up once scheduled by IR team See med changes as below   Discharge Diagnoses:  MAIN problem for hospitalization   Decompensated cirrhosis requiring frequent paracentesis  Please see below for itemized issues addressed in HOpsital- refer to other progress notes for clarity if needed  Discharge Condition: Improved  Diet recommendation: Heart healthy low-salt fluid restricted  Filed Weights   01/27/21 1122  Weight: 111.6 kg    History of present illness:  70 year old white female Recent diagnosis cirrhosis (etiology unclear hepatitis panel negative TSH, HIV nonreactive--?  EtOH/NASH-autoimmune work-up pending) with ascites requiring frequent large-volume paracentesis 8/17 8/19 8/23 8/26--intolerant diuretics secondary to hypotension started on midodrine--patient transferred to [facility] for TIPS procedure + Abdominal pain + right-sided pain + episodic diarrhea  Hospital Course:    Decompensated cryptogenic cirrhosis, Meld-Na 12 ~ 50% mortality risk Autoimmune work-up apparently in process in the outpatient setting  Platelets stable in the 50-60 range and secondary to liver disease Status post TIPS procedure Dr. Elby Showers 8/31 and tolerated this very well --diuretics discontinued by GI this admit propranolol held secondary to bradycardia Midodrine increased to 10 mg 3 times daily as MAP around 70 Outpatient follow-up with [facility] gastroenterology Undifferentiated diarrhea Low risk for C. difficile-monitor trends Hyperkalemia Was likely secondary to potassium sparing  diuretic which was discontinued this admission ATN superimposed on CKD 1 Improved during hospitalization HFpEF based on echo 8/18 Diuretics as above  Procedures: TIPS procedure 8/31 Dr. Elby Showers  Consultations: Interventional radiology Dr. Domingo Dimes suttle Gastroenterology Dr. Christella Hartigan  Discharge Exam: Vitals:   01/28/21 2001 01/29/21 0707  BP: 97/62 (!) 102/57  Pulse: 64 62  Resp: 18 18  Temp: 98.1 F (36.7 C) (!) 97.5 F (36.4 C)  SpO2: 96% 97%    Subj on day of d/c   Awake alert had 1 loose stool not confused coherent no distress ambulatory and able to self manage  General Exam on discharge  EOMI NCAT no focal deficit not confused clear no distress no icterus no pallor Chest is clinically clear no added sound no rales abdomen obese no shifting dullness at present Mild lower extremity edema ROM intact moving all 4 limbs equally power 5/5  no flap no tremor  Discharge Instructions   Discharge Instructions     Diet - low sodium heart healthy   Complete by: As directed    Discharge instructions   Complete by: As directed    You will need follow-up in the outpatient setting with Dr. Servando Snare or Dr. Sharlet Salina, the gastroenterologist that saw you at [facility] regional--- we have placed their information in your paperwork for close follow-up in the outpatient setting.   You have been instructed not to use any further diuretics or any medication to control your fluid as the procedure that you had will help with this You should continue lactulose 2-3 times a day for at least 2 stools a day to ensure that you detoxify your body.  You need to be on the midodrine with as this will help promote good blood supply to the rest of your body in addition to your liver circulation Please follow-up also  with interventional radiology who will be in touch and will want to do further scanning in the outpatient setting for you   Increase activity slowly   Complete by: As directed    No wound care    Complete by: As directed       Allergies as of 01/29/2021   No Known Allergies      Medication List     TAKE these medications    lactulose 10 GM/15ML solution Commonly known as: CHRONULAC Take 30 mLs (20 g total) by mouth 3 (three) times daily.   midodrine 10 MG tablet Commonly known as: PROAMATINE Take 1 tablet (10 mg total) by mouth 3 (three) times daily with meals.       No Known Allergies  Follow-up Information     Suttle, Thressa Sheller, MD Follow up.   Specialties: Interventional Radiology, Diagnostic Radiology, Radiology Why: IR scheduler will call you with appointment date/time for follow up, typically 4-6 weeks after your procedure. Please call [phone] with any questions or concerns prior to your appointment. Contact information: [address] [place] [address] [phone]                  The results of significant diagnostics from this hospitalization (including imaging, microbiology, ancillary and laboratory) are listed below for reference.    Significant Diagnostic Studies: CT Abdomen Pelvis Wo Contrast  Result Date: 01/12/2021 CLINICAL DATA:  Abdominal distension EXAM: CT ABDOMEN AND PELVIS WITHOUT CONTRAST TECHNIQUE: Multidetector CT imaging of the abdomen and pelvis was performed following the standard protocol without IV contrast. COMPARISON:  None. FINDINGS: Lower chest: Lung bases demonstrate small right-sided pleural effusion. Atelectasis at the right base. Normal cardiac size. Small cardio phrenic lymph nodes. Small distal esophageal hiatal hernia. Hepatobiliary: Cirrhotic morphology of the liver. Multiple calcified gallstones. Subcentimeter hypodensity within the liver too small to further characterize. No biliary dilatation. Recanalized paraumbilical vein. Pancreas: Unremarkable. No pancreatic ductal dilatation or surrounding inflammatory changes. Spleen: Enlarged measuring 15.5 cm craniocaudad. Adrenals/Urinary Tract: Adrenal  glands are normal. Multiple bilateral kidney stones without hydronephrosis. The bladder is difficult to see due to ascites. Stomach/Bowel: Centralization of the bowel. No bowel distension. No acute bowel wall thickening. Negative appendix. Vascular/Lymphatic: Mild aortic atherosclerosis. No aneurysm. No suspicious nodes. Tortuous portosystemic collaterals in the left upper quadrant. Reproductive: Uterus and bilateral adnexa are unremarkable. Other: Large volume of abdominopelvic ascites.  No free air. Musculoskeletal: No acute or suspicious osseous abnormality IMPRESSION: 1. Liver cirrhosis with portal hypertension. Large quantity of abdominopelvic ascites. 2. No evidence for bowel obstruction 3. Trace right-sided pleural effusion 4. Gallstones 5. Nonobstructing kidney stones Electronically Signed   By: Jasmine Pang M.D.   On: 01/12/2021 17:31   US Abdomen Limited  Result Date: 01/25/2021 CLINICAL DATA:  70 year old female with history of cirrhosis and recurrent ascites. EXAM: LIMITED ABDOMEN ULTRASOUND FOR ASCITES TECHNIQUE: Limited ultrasound survey for ascites was performed in all four abdominal quadrants. COMPARISON:  01/22/2021 FINDINGS: Trace ascites in all 4 quadrants, most prominent in the right upper quadrant. No appropriate window for paracentesis at this time. IMPRESSION: Trace ascites, no appropriate window for paracentesis at this time. Marliss Coots, MD [facility] Radiology Electronically Signed   By: Marliss Coots M.D.   On: 01/25/2021 16:03   US Paracentesis  Result Date: 01/25/2021 INDICATION: Ascites EXAM: ULTRASOUND GUIDED  PARACENTESIS MEDICATIONS: None. COMPLICATIONS: None immediate. PROCEDURE: Informed written consent was obtained from the patient after a discussion  of the risks, benefits and alternatives to treatment. A timeout was performed prior to the initiation of the procedure. Initial ultrasound scanning demonstrates a large  amount of ascites within the right lower abdominal quadrant. The right lower abdomen was prepped and draped in the usual sterile fashion. 1% lidocaine was used for local anesthesia. Following this, a 19 gauge Yueh catheter was introduced. An ultrasound image was saved for documentation purposes. The paracentesis was performed. The catheter was removed and a dressing was applied. The patient tolerated the procedure well without immediate post procedural complication. FINDINGS: A total of approximately 4.6 L of clear, straw-colored fluid was removed. IMPRESSION: Successful ultrasound-guided paracentesis yielding 4.6 liters of clear peritoneal fluid. Electronically Signed   By: Olive Bass M.D.   On: 01/25/2021 08:17   US Paracentesis  Result Date: 01/20/2021 INDICATION: Ascites with request received for paracentesis. EXAM: ULTRASOUND GUIDED diagnostic and therapeutic PARACENTESIS MEDICATIONS: Local 1% lidocaine only. COMPLICATIONS: None immediate. PROCEDURE: Informed written consent was obtained from the patient after a discussion of the risks, benefits and alternatives to treatment. A timeout was performed prior to the initiation of the procedure. Initial ultrasound scanning demonstrates a large amount of ascites within the right lower abdominal quadrant. The right lower abdomen was prepped and draped in the usual sterile fashion. 1% lidocaine was used for local anesthesia. Following this, a 6 Fr Safe-T-Centesis catheter was introduced. An ultrasound image was saved for documentation purposes. The paracentesis was performed. The catheter was removed and a dressing was applied. The patient tolerated the procedure well without immediate post procedural complication. The procedure was stopped secondary to hypotension. FINDINGS: A total of approximately 5,000 mL of clear yellow fluid was removed. Samples were sent to the laboratory as requested by the clinical team. IMPRESSION: Successful ultrasound-guided  paracentesis yielding 5 liters of peritoneal fluid. Read By: Pattricia Boss PA-C Electronically Signed   By: Olive Bass M.D.   On: 01/19/2021 16:46   US Paracentesis  Result Date: 01/15/2021 INDICATION: Patient with history of cirrhosis, portal hypertension, recurrent ascites. Request received for therapeutic paracentesis. EXAM: ULTRASOUND GUIDED  THERAPEUTIC PARACENTESIS MEDICATIONS: None. COMPLICATIONS: None immediate. PROCEDURE: Informed written consent was obtained from the patient after a discussion of the risks, benefits and alternatives to treatment. A timeout was performed prior to the initiation of the procedure. Initial ultrasound scanning demonstrates a large amount of ascites within the left lower abdominal quadrant. The left lower abdomen was prepped and draped in the usual sterile fashion. 1% lidocaine was used for local anesthesia. Following this, a 19 gauge, 10-cm, Yueh catheter was introduced. An ultrasound image was saved for documentation purposes. The paracentesis was performed. The catheter was removed and a dressing was applied. The patient tolerated the procedure well without immediate post procedural complication. Patient received post-procedure intravenous albumin; see nursing notes for details. FINDINGS: A total of approximately 6.8 liters of yellow fluid was removed. IMPRESSION: Successful ultrasound-guided therapeutic paracentesis yielding 6.8 liters of peritoneal fluid. Read by: Jeananne Rama, PA-C Electronically Signed   By: Olive Bass M.D.   On: 01/15/2021 16:40   US Paracentesis  Result Date: 01/13/2021 INDICATION: Recurrent ascites request received for paracentesis EXAM: ULTRASOUND GUIDED THERAPEUTIC PARACENTESIS MEDICATIONS: Local 1% lidocaine only COMPLICATIONS: None immediate. PROCEDURE: Informed written consent was obtained from the patient after a discussion of the risks, benefits and alternatives to treatment. A timeout was performed prior to the initiation of the  procedure. Initial ultrasound scanning demonstrates a large amount of ascites within the right upper  abdominal quadrant. The right upper abdomen was prepped and draped in the usual sterile fashion. 1% lidocaine was used for local anesthesia. Following this, a 19 gauge, 7-cm, Yueh catheter was introduced. An ultrasound image was saved for documentation purposes. The paracentesis was performed. The catheter was removed and a dressing was applied. The patient tolerated the procedure well without immediate post procedural complication. FINDINGS: A total of approximately 5 L of clear yellow fluid was removed. IMPRESSION: Successful ultrasound-guided paracentesis yielding 5 liters of peritoneal fluid. Read By: Pattricia Boss PA-C Electronically Signed   By: Judie Petit.  Shick M.D.   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 EXAM: 1. Ultrasound-guided vascular access of the right internal jugular vein and right greater saphenous vein. 2. Intravascular ultrasound. 3. Central and portal venous manometry. 4. TIPS creation. MEDICATIONS: As antibiotic prophylaxis, 2 g Rocephin, intravenous was ordered pre-procedure and administered intravenously within one hour of incision. ANESTHESIA/SEDATION: General - as administered by the Anesthesia department CONTRAST:  74 mL Omnipaque 240, intravenous FLUOROSCOPY TIME:  Fluoroscopy Time: 29 minutes 0 seconds (758 mGy). COMPLICATIONS: None immediate. PROCEDURE: Informed written consent was obtained from the patient after a thorough discussion of the procedural risks, benefits and alternatives. All questions were addressed. Maximal Sterile Barrier Technique was utilized including caps, mask, sterile gowns, sterile gloves, sterile drape, hand hygiene and skin antiseptic. A timeout was performed prior to the initiation of the procedure. Preprocedure ultrasound evaluation demonstrated patency of  the right greater saphenous vein. A small skin nick was made at the planned needle entry site. Under direct ultrasound visualization, a 21 gauge micropuncture needle was directed into the right common femoral vein. A permanent image was captured and stored in the record. The micropuncture set was exchanged over a J wire for an 8 [place], 11 cm vascular sheath. Through the vascular sheath, an ICE catheter was directed under fluoroscopic and ultrasound guidance to the level of the perihepatic inferior vena cava. Evaluation of hepatic vasculature was performed which demonstrated a patent appearing and normal caliber portal vein, patent appearing right and middle hepatic veins. There was an optimal window from the right hepatic vein to the central right portal vein. Preprocedure ultrasound evaluation demonstrated patency of the right internal jugular vein. A small skin nick was made at the planned needle entry site. Under direct ultrasound visualization, a 21 gauge micropuncture needle was directed into the right internal jugular vein. A permanent image was captured and stored in the record. The micropuncture set was exchanged over a Rosen wire for a 10 French tips sheath after serial dilation. The Rosen wire was directed to the inferior vena cava. The sheath was positioned in the right atrium and right atrial pressures were measured as a mean of 10 mmHg. Through the indwelling TIPS sheath, a 5 [place] MPA catheter was guided into the right hepatic vein. Limited venogram was performed to confirm patency. The Rosen wire was then inserted through the catheter over which the TIPS sheath was advanced. Catheter wire removed and the Argon ScorpionX introducer and needle were inserted. Adequate window was optimized with the ICE catheter which was used to guide a single pass from the right hepatic vein to the right portal vein. Intravascular location was confirmed with the injection of contrast. A Glidewire Advantage was then  inserted and directed to the splenic vein. The needle was removed. A marking pigtail catheter was then inserted with the  pigtail portion coiled in the portal confluence. Portal manometry was performed which demonstrated portal venous hypertension with mean pressure of 34 mmHg (portosystemic gradient of 24 mmHg). Simultaneous portal venogram and hepatic venogram was performed for planning purposes and stent selection. The pigtail catheter was removed and an 8 mm x 4 Atheltis balloon was inserted to dilate the intra parenchymal track. Next, an 8-10 mm, 7+2 mm Viatorr stent graft was inserted and deployed. The pigtail catheter was reinserted and central and portal pressures were measured. The mean right atrial pressure after TIPS placement was 12 mmHg, portal venous pressure 28 mmHg (portosystemic gradient of 16 mmHg). Therefore, the pigtail catheter was exchanged for an 8 mm x 4 cm Athletis balloon and the entire tips stent was expanded 8 mm. The pigtail catheter was reinserted and central and portal pressures were measured. The mean right atrial pressure after balloon expansion was 13 mmHg, portal venous pressure 22 mmHg (portosystemic gradient of 9 mmHg). Completion portal venogram was performed which demonstrated patency of the indwelling TIPS, no evidence of significant gastric varices. The catheters and sheaths were removed. Hemostasis was achieved with manual compression at the right neck and right groin puncture sites. The patient tolerated the procedure well was transferred to the postanesthesia care unit in stable condition. My associate, Dr. Richarda OverlieAdam Henn, assisted during this procedure. IMPRESSION: 1. Portal hypertension with portosystemic gradient of 24 mmHg prior to TIPS creation. 2. Technically successful intravascular ultrasound guided TIPS creation and placement of a 7 cm+2 cm, 8-10 mm Viatorr stent graft with post deployment balloon molding to 8 mm and reduction of portosystemic gradient to 9 mmHg. Marliss Cootsylan  Suttle, MD [facility] Electronically Signed   By: Marliss Cootsylan  Suttle M.D.   On: 01/27/2021 15:51   ECHOCARDIOGRAM COMPLETE  Result Date: 01/14/2021    ECHOCARDIOGRAM REPORT   Patient Name:   Otilio CarpenVELYN S Ku Date of Exam: 01/14/2021 Medical Rec #:  [id number]        Height:       64.0 in Accession #:    [id number]       Weight:       246.0 lb Date of Birth:  July 07, 1950       BSA:          2.136 m Patient Age:    69 years         BP:           118/59 mmHg Patient Gender: F                HR:           83 bpm. Exam Location:  [facility] Procedure: 2D Echo, Cardiac Doppler and Color Doppler Indications:     Dyspnea R06.00  History:         Patient has no prior history of Echocardiogram examinations.                  Bronchitis.  Sonographer:     Cristela BlueJerry Hege Referring Phys:  [id number] AMY N COX Diagnosing Phys: Marcina MillardAlexander Paraschos MD  Sonographer Comments: Image acquisition challenging due to patient body habitus. IMPRESSIONS  1. Left ventricular ejection fraction, by estimation, is 65 to 70%. The left ventricle has normal function. The left ventricle has no regional wall motion abnormalities. Left ventricular diastolic parameters are consistent with Grade I diastolic dysfunction (impaired relaxation).  2. Right ventricular systolic function is normal. The right ventricular size is normal.  3. The mitral valve  is normal in structure. Trivial mitral valve regurgitation. No evidence of mitral stenosis.  4. The aortic valve is normal in structure. Aortic valve regurgitation is not visualized. No aortic stenosis is present.  5. The inferior vena cava is normal in size with greater than 50% respiratory variability, suggesting right atrial pressure of 3 mmHg. FINDINGS  Left Ventricle: Left ventricular ejection fraction, by estimation, is 65 to 70%. The left ventricle has normal function. The left ventricle has no regional wall motion abnormalities. The left ventricular internal  cavity size was normal in size. There is  no left ventricular hypertrophy. Left ventricular diastolic parameters are consistent with Grade I diastolic dysfunction (impaired relaxation). Right Ventricle: The right ventricular size is normal. No increase in right ventricular wall thickness. Right ventricular systolic function is normal. Left Atrium: Left atrial size was normal in size. Right Atrium: Right atrial size was normal in size. Pericardium: There is no evidence of pericardial effusion. Mitral Valve: The mitral valve is normal in structure. Trivial mitral valve regurgitation. No evidence of mitral valve stenosis. Tricuspid Valve: The tricuspid valve is normal in structure. Tricuspid valve regurgitation is trivial. No evidence of tricuspid stenosis. Aortic Valve: The aortic valve is normal in structure. Aortic valve regurgitation is not visualized. No aortic stenosis is present. Aortic valve mean gradient measures 4.0 mmHg. Aortic valve peak gradient measures 6.7 mmHg. Aortic valve area, by VTI measures 2.91 cm. Pulmonic Valve: The pulmonic valve was normal in structure. Pulmonic valve regurgitation is not visualized. No evidence of pulmonic stenosis. Aorta: The aortic root is normal in size and structure. Venous: The inferior vena cava is normal in size with greater than 50% respiratory variability, suggesting right atrial pressure of 3 mmHg. IAS/Shunts: No atrial level shunt detected by color flow Doppler.  LEFT VENTRICLE PLAX 2D LVIDd:         4.41 cm  Diastology LVIDs:         2.68 cm  LV e' medial:    4.90 cm/s LV PW:         1.17 cm  LV E/e' medial:  9.4 LV IVS:        0.79 cm  LV e' lateral:   6.20 cm/s LVOT diam:     2.00 cm  LV E/e' lateral: 7.5 LV SV:         68 LV SV Index:   32 LVOT Area:     3.14 cm  RIGHT VENTRICLE RV Basal diam:  3.13 cm RV S prime:     12.40 cm/s TAPSE (M-mode): 3.5 cm LEFT ATRIUM             Index       RIGHT ATRIUM           Index LA diam:        4.30 cm 2.01 cm/m  RA Area:      11.40 cm LA Vol (A2C):   49.9 ml 23.36 ml/m RA Volume:   22.20 ml  10.39 ml/m LA Vol (A4C):   33.4 ml 15.63 ml/m LA Biplane Vol: 43.3 ml 20.27 ml/m  AORTIC VALVE AV Area (Vmax):    2.90 cm AV Area (Vmean):   2.75 cm AV Area (VTI):     2.91 cm AV Vmax:           129.00 cm/s AV Vmean:          95.600 cm/s AV VTI:            0.234 m  AV Peak Grad:      6.7 mmHg AV Mean Grad:      4.0 mmHg LVOT Vmax:         119.00 cm/s LVOT Vmean:        83.700 cm/s LVOT VTI:          0.217 m LVOT/AV VTI ratio: 0.93  AORTA Ao Root diam: 3.10 cm MITRAL VALVE               TRICUSPID VALVE MV Area (PHT): 3.01 cm    TR Peak grad:   27.5 mmHg MV Decel Time: 252 msec    TR Vmax:        262.00 cm/s MV E velocity: 46.30 cm/s MV A velocity: 83.10 cm/s  SHUNTS MV E/A ratio:  0.56        Systemic VTI:  0.22 m                            Systemic Diam: 2.00 cm Marcina Millard MD Electronically signed by Marcina Millard MD Signature Date/Time: 01/14/2021/1:20:09 PM    Final    US LIVER DOPPLER  Result Date: 01/18/2021 CLINICAL DATA:  Cirrhosis EXAM: DUPLEX ULTRASOUND OF LIVER TECHNIQUE: Color and duplex Doppler ultrasound was performed to evaluate the hepatic in-flow and out-flow vessels. COMPARISON:  CT 01/12/2021 FINDINGS: Liver: Coarse echotexture without focal lesion. Nodular hepatic contour. No focal lesion, mass or intrahepatic biliary ductal dilatation. Main Portal Vein size: 1.4 cm Portal Vein Velocities (all hepatopetal): Main Prox:  28 cm/sec Main Mid: 31 cm/sec Main Dist:  22 cm/sec Right: 20 cm/sec Left: 13 cm/sec Hepatic Vein Velocities (all hepatofugal): Right:  14 cm/sec Middle:  43 cm/sec Left:  27 cm/sec IVC: Present and patent with normal respiratory phasicity. Velocity 47 cm/sec Hepatic Artery Velocity:  49 cm/sec Splenic Vein Velocity:  21 cm/sec Spleen: 19 cm x 6 cm x 13 cm with a total volume of 739 cm^3 (411 cm^3 is upper limit normal) Portal Vein Occlusion/Thrombus: No Splenic Vein Occlusion/Thrombus:  No Ascites: Present, mild Varices: None Recanalized umbilical vein incidentally noted. Gallbladder wall thickening up to 8 mm. IMPRESSION: 1. Unremarkable hepatic vascular Doppler evaluation. 2. Nodular hepatic contour suggesting cirrhosis, with stigmata of portal venous hypertension including splenomegaly and ascites. 3. Nonspecific gallbladder wall thickening. Electronically Signed   By: Corlis Leak M.D.   On: 01/18/2021 13:25    Microbiology: Recent Results (from the past 240 hour(s))  Body fluid culture w Gram Stain     Status: None   Collection Time: 01/19/21  4:00 PM   Specimen: PATH Cytology Peritoneal fluid  Result Value Ref Range Status   Specimen Description   Final    PERITONEAL Performed at [facility], [address], [place], [place] [address]    Special Requests   Final    NONE Performed at [facility], [address] [place], [place] [address]    Gram Stain   Final    NO ORGANISMS SEEN SQUAMOUS EPITHELIAL CELLS PRESENT FEW WBC PRESENT, PREDOMINANTLY MONONUCLEAR NO ORGANISMS SEEN BACTERIA    Culture   Final    NO GROWTH 3 DAYS Performed at [facility], [address], [place], [place] [address]    Report Status 01/23/2021 FINAL  Final     Labs: Basic Metabolic Panel: Recent Labs  Lab 01/24/21 0504 01/25/21 0429 01/27/21 0418 01/28/21 0219 01/29/21 0011  NA 136 134* 133* 134* 135  K 4.2 4.1 4.3 5.1 4.4  CL 97* 96* 95* 97* 99  CO2 32 30 31 27 29   GLUCOSE 94 95 94 131* 94  BUN 21 21 26* 24* 23  CREATININE 1.27* 1.34* 1.65* 1.39* 1.60*  CALCIUM 8.9 8.9 8.9 9.1 9.1   Liver Function Tests: Recent Labs  Lab 01/24/21 0504 01/25/21 0429 01/27/21 0418 01/28/21 0219 01/29/21 0011  AST 38 37 31 42* 51*  ALT 14 14 14 17 23   ALKPHOS 41 48 47 48 51  BILITOT 1.2 0.8 1.1 1.0 1.0  PROT 5.7* 5.9* 6.2* 6.2* 6.0*  ALBUMIN 3.1* 2.9* 2.9* 2.7* 2.8*   No results for input(s): LIPASE, AMYLASE in the last 168 hours. No results for  input(s): AMMONIA in the last 168 hours. CBC: Recent Labs  Lab 01/23/21 0541 01/24/21 0504 01/25/21 0429 01/27/21 0418 01/28/21 0219  WBC 3.2* 3.5* 3.8* 3.2* 4.4  NEUTROABS  --   --   --  2.0 3.8  HGB 13.1 12.8 12.3 12.5 13.0  HCT 40.3 39.6 37.3 38.6 39.2  MCV 79.2* 80.3 80.4 80.1 78.6*  PLT 59* 62* 69* 68* 59*   Cardiac Enzymes: No results for input(s): CKTOTAL, CKMB, CKMBINDEX, TROPONINI in the last 168 hours. BNP: BNP (last 3 results) No results for input(s): BNP in the last 8760 hours.  ProBNP (last 3 results) No results for input(s): PROBNP in the last 8760 hours.  CBG: Recent Labs  Lab 01/23/21 0726  GLUCAP 89       Signed:  03/30/21 MD   [facility] 01/29/2021, 8:21 AM

## 2021-02-04 ENCOUNTER — Other Ambulatory Visit: Payer: Self-pay | Admitting: Interventional Radiology

## 2021-02-04 DIAGNOSIS — K7460 Unspecified cirrhosis of liver: Secondary | ICD-10-CM

## 2021-02-15 ENCOUNTER — Other Ambulatory Visit: Payer: Self-pay | Admitting: Student

## 2021-02-15 ENCOUNTER — Other Ambulatory Visit (HOSPITAL_COMMUNITY)
Admission: AD | Admit: 2021-02-15 | Discharge: 2021-02-15 | Disposition: A | Discharge status: Home / Self Care | Payer: Medicare Other | Source: Ambulatory Visit | Attending: Interventional Radiology | Admitting: Interventional Radiology

## 2021-02-15 DIAGNOSIS — K7460 Unspecified cirrhosis of liver: Secondary | ICD-10-CM

## 2021-02-15 LAB — COMPREHENSIVE METABOLIC PANEL
ALT: 32 U/L (ref 0–44)
AST: 78 U/L — ABNORMAL HIGH (ref 15–41)
Albumin: 3 g/dL — ABNORMAL LOW (ref 3.5–5.0)
Alkaline Phosphatase: 69 U/L (ref 38–126)
Anion gap: 7 (ref 5–15)
BUN: 11 mg/dL (ref 8–23)
CO2: 24 mmol/L (ref 22–32)
Calcium: 9.1 mg/dL (ref 8.9–10.3)
Chloride: 106 mmol/L (ref 98–111)
Creatinine, Ser: 0.89 mg/dL (ref 0.44–1.00)
GFR, Estimated: >60 mL/min (ref >60)
Glucose, Bld: 83 mg/dL (ref 70–99)
Potassium: 4.6 mmol/L (ref 3.5–5.1)
Sodium: 137 mmol/L (ref 135–145)
Total Bilirubin: 1.7 mg/dL — ABNORMAL HIGH (ref 0.3–1.2)
Total Protein: 6.7 g/dL (ref 6.5–8.1)

## 2021-02-15 LAB — CBC
HCT: 37.9 % (ref 36.0–46.0)
Hemoglobin: 12.3 g/dL (ref 12.0–15.0)
MCH: 26.9 pg (ref 26.0–34.0)
MCHC: 32.5 g/dL (ref 30.0–36.0)
MCV: 82.8 fL (ref 80.0–100.0)
Platelets: 52 10*3/uL — ABNORMAL LOW (ref 150–400)
RBC: 4.58 MIL/uL (ref 3.87–5.11)
RDW: 19.9 % — ABNORMAL HIGH (ref 11.5–15.5)
WBC: 3.8 10*3/uL — ABNORMAL LOW (ref 4.0–10.5)
nRBC: 0 % (ref 0.0–0.2)

## 2021-02-15 LAB — PROTIME-INR
INR: 1.5 — ABNORMAL HIGH (ref 0.8–1.2)
Prothrombin Time: 17.8 seconds — ABNORMAL HIGH (ref 11.4–15.2)

## 2021-02-16 ENCOUNTER — Other Ambulatory Visit: Payer: Self-pay | Admitting: Student

## 2021-02-16 MED ORDER — FUROSEMIDE 20 MG PO TABS: 20.0000 mg | ORAL_TABLET | Freq: Two times a day (BID) | ORAL | 0 refills | Status: DC

## 2021-02-23 ENCOUNTER — Encounter: Payer: Self-pay | Admitting: *Deleted

## 2021-02-23 ENCOUNTER — Ambulatory Visit
Admission: RE | Admit: 2021-02-23 | Discharge: 2021-02-23 | Disposition: A | Discharge status: Home / Self Care | Payer: Medicare Other | Source: Ambulatory Visit | Attending: Physician Assistant | Admitting: Physician Assistant

## 2021-02-23 ENCOUNTER — Ambulatory Visit
Admission: RE | Admit: 2021-02-23 | Discharge: 2021-02-23 | Disposition: A | Discharge status: Home / Self Care | Payer: Medicare Other | Source: Ambulatory Visit | Attending: Interventional Radiology | Admitting: Interventional Radiology

## 2021-02-23 ENCOUNTER — Other Ambulatory Visit: Payer: Self-pay

## 2021-02-23 DIAGNOSIS — K7460 Unspecified cirrhosis of liver: Secondary | ICD-10-CM

## 2021-02-23 DIAGNOSIS — R188 Other ascites: Secondary | ICD-10-CM

## 2021-02-23 HISTORY — PX: IR RADIOLOGIST EVAL & MGMT: IMG5224

## 2021-02-23 NOTE — Progress Notes (Signed)
Reason for follow up:  Status post TIPS (01/27/21)  Subjective: Ms. Mayol has overall done well since TIPS creation, discharge on post-procedure day #2.   She was discharged on midodrine and lactulose.  She called last week with complaints of 20 pounds weight gain since discharge and associated leg swelling.  We arranged for outpatient labs which showed significant improvement in her creatinine, therefore lasix 20 mg BID was started.  She still has leg swelling, which has minimally improved.  She has not noticed a significant increase in her urine output.  She denies and hepatic encephalopathy.  She continues have a good appetite.  She is working with physical therapy to improve her lower extremity strength.   She expresses interest in establishing a primary care physician.   Objective:  Vital Signs: BP 106/61 (BP Location: Right Arm)   Pulse 76   SpO2 98%   Physical Exam Constitutional:      General: She is not in acute distress.    Appearance: She is not ill-appearing.  HENT:     Head: Normocephalic.     Mouth/Throat:     Mouth: Mucous membranes are moist.  Cardiovascular:     Rate and Rhythm: Normal rate and regular rhythm.  Pulmonary:     Effort: Pulmonary effort is normal.     Breath sounds: Normal breath sounds.  Abdominal:     General: There is no distension.     Palpations: Abdomen is soft.  Musculoskeletal:     Right lower leg: Edema present.     Left lower leg: Edema present.  Skin:    General: Skin is warm and dry.     Coloration: Skin is not jaundiced.  Neurological:     Mental Status: She is alert and oriented to person, place, and time.  Psychiatric:        Mood and Affect: Mood normal.        Behavior: Behavior normal.    Current Outpatient Medications on File Prior to Encounter  Medication Sig Dispense Refill   furosemide (LASIX) 20 MG tablet Take 1 tablet (20 mg total) by mouth 2 (two) times daily. 60 tablet 0   lactulose (CHRONULAC) 10 GM/15ML  solution Take 30 mLs (20 g total) by mouth 3 (three) times daily. 8100 mL 3   midodrine (PROAMATINE) 10 MG tablet Take 1 tablet (10 mg total) by mouth 3 (three) times daily with meals. 90 tablet 1   No current facility-administered medications on file prior to encounter.     Imaging: TIPS 01/27/21  7+2 Viatorr, right hepatic vein to right portal vein  [place] TIPS (02/23/21) Patent TIPS.  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  She has overall done well post-procedure, with resolution of ascites, now with bilateral lower extremity edema.  She was started on lasix 20 mg BID last week without significant improvement.    -increase lasix to 40 mg BID -continue lactulose -continue midorine for now -establish primary care - will notify Dr. Mahala Menghini -follow up in 1 month via telephone visit with repeat labs (CBC, CMP, INR)    Electronically Signed: Bennie Dallas, MD 02/23/2021, 11:18 AM   I spent a total of 35 Minutes in  face to face clinical consultation, greater than 50% of which was counseling/coordinating care for portal hypertension.

## 2021-02-25 ENCOUNTER — Other Ambulatory Visit: Payer: Self-pay | Admitting: Interventional Radiology

## 2021-02-25 ENCOUNTER — Other Ambulatory Visit: Payer: Self-pay

## 2021-02-25 DIAGNOSIS — R188 Other ascites: Secondary | ICD-10-CM

## 2021-02-25 DIAGNOSIS — K7460 Unspecified cirrhosis of liver: Secondary | ICD-10-CM

## 2021-03-09 ENCOUNTER — Other Ambulatory Visit: Payer: Self-pay | Admitting: Interventional Radiology

## 2021-03-09 ENCOUNTER — Other Ambulatory Visit: Payer: Self-pay

## 2021-03-09 DIAGNOSIS — K7460 Unspecified cirrhosis of liver: Secondary | ICD-10-CM

## 2021-03-09 DIAGNOSIS — R188 Other ascites: Secondary | ICD-10-CM

## 2021-03-12 ENCOUNTER — Other Ambulatory Visit: Payer: Self-pay

## 2021-03-12 ENCOUNTER — Ambulatory Visit (HOSPITAL_COMMUNITY)
Admission: RE | Admit: 2021-03-12 | Discharge: 2021-03-12 | Disposition: A | Discharge status: Home / Self Care | Payer: Medicare Other | Source: Ambulatory Visit | Attending: Interventional Radiology | Admitting: Interventional Radiology

## 2021-03-12 DIAGNOSIS — K7460 Unspecified cirrhosis of liver: Secondary | ICD-10-CM | POA: Insufficient documentation

## 2021-03-12 DIAGNOSIS — R188 Other ascites: Secondary | ICD-10-CM | POA: Insufficient documentation

## 2021-03-12 LAB — COMPLETE METABOLIC PANEL WITH GFR
AG Ratio: 0.9 (calc) — ABNORMAL LOW (ref 1.0–2.5)
ALT: 10 U/L (ref 6–29)
AST: 33 U/L (ref 10–35)
Albumin: 3 g/dL — ABNORMAL LOW (ref 3.6–5.1)
Alkaline phosphatase (APISO): 82 U/L (ref 37–153)
BUN: 11 mg/dL (ref 7–25)
CO2: 31 mmol/L (ref 20–32)
Calcium: 9.1 mg/dL (ref 8.6–10.4)
Chloride: 105 mmol/L (ref 98–110)
Creat: 0.85 mg/dL (ref 0.50–1.05)
Globulin: 3.3 g/dL (calc) (ref 1.9–3.7)
Glucose, Bld: 81 mg/dL (ref 65–99)
Potassium: 3.7 mmol/L (ref 3.5–5.3)
Sodium: 141 mmol/L (ref 135–146)
Total Bilirubin: 2.2 mg/dL — ABNORMAL HIGH (ref 0.2–1.2)
Total Protein: 6.3 g/dL (ref 6.1–8.1)
eGFR: 75 mL/min/{1.73_m2} (ref >=60)

## 2021-03-12 LAB — CBC
HCT: 35.6 % (ref 35.0–45.0)
Hemoglobin: 11.6 g/dL — ABNORMAL LOW (ref 11.7–15.5)
MCH: 28 pg (ref 27.0–33.0)
MCHC: 32.6 g/dL (ref 32.0–36.0)
MCV: 85.8 fL (ref 80.0–100.0)
MPV: 10.5 fL (ref 7.5–12.5)
Platelets: 56 10*3/uL — ABNORMAL LOW (ref 140–400)
RBC: 4.15 10*6/uL (ref 3.80–5.10)
RDW: 18.4 % — ABNORMAL HIGH (ref 11.0–15.0)
WBC: 3.2 10*3/uL — ABNORMAL LOW (ref 3.8–10.8)

## 2021-03-12 LAB — PROTIME-INR
INR: 1.4 — ABNORMAL HIGH
Prothrombin Time: 13.6 s — ABNORMAL HIGH (ref 9.0–11.5)

## 2021-03-12 MED ORDER — IOHEXOL 350 MG/ML SOLN
100.0000 mL | Freq: Once | INTRAVENOUS | Status: AC | PRN
  Administered 2021-03-12: 100 mL via INTRAVENOUS

## 2021-03-17 ENCOUNTER — Other Ambulatory Visit: Payer: Self-pay

## 2021-03-17 ENCOUNTER — Encounter: Payer: Self-pay | Admitting: *Deleted

## 2021-03-17 ENCOUNTER — Ambulatory Visit
Admission: RE | Admit: 2021-03-17 | Discharge: 2021-03-17 | Disposition: A | Discharge status: Home / Self Care | Payer: Medicare Other | Source: Ambulatory Visit | Attending: Interventional Radiology | Admitting: Interventional Radiology

## 2021-03-17 ENCOUNTER — Other Ambulatory Visit: Payer: Self-pay | Admitting: Student

## 2021-03-17 DIAGNOSIS — R188 Other ascites: Secondary | ICD-10-CM

## 2021-03-17 HISTORY — PX: IR RADIOLOGIST EVAL & MGMT: IMG5224

## 2021-03-17 MED ORDER — FUROSEMIDE 20 MG PO TABS
40.0000 mg | ORAL_TABLET | Freq: Two times a day (BID) | ORAL | 3 refills | Status: AC
  Filled 2021-03-17: qty 120, 30d supply, fill #0

## 2021-03-17 NOTE — Progress Notes (Signed)
Reason for follow up:  Status post TIPS (01/27/21), review CT findings   History of present illness: Ms. Iversen presents today for review of recent multiphase abdominal CT for further evaluation of liver mass seen on post-TIPS ultrasound.  Fortunately, the CT demonstrates no evidence of hepatoma.   After last visit, her lasix was increased from 20 mg BID to 40 mg BID.  She has been compliant with this with the exception of a few days when she ran out of medication prior to refill.  She still complains of leg swelling which has minimally improved since last visit.  She noted some weeping from her left calf which has ceased.  No significant abdominal pain or distension.  No shortness of breath.  She denies any hepatic encephalopathy.  She experiences some light headedness when lying supine.  She has yet to establish care with a primary care physician.  I stressed the importance of doing this ASAP.     Past Medical History:  Diagnosis Date   Bronchitis     Past Surgical History:  Procedure Laterality Date   IR RADIOLOGIST EVAL & MGMT  02/23/2021   IR TIPS  01/27/2021   RADIOLOGY WITH ANESTHESIA N/A 01/27/2021   Procedure: IR WITH ANESTHESIA - TIPS;  Surgeon: Bennie Dallas, MD;  Location: [facility] OR;  Service: Radiology;  Laterality: N/A;   TONSILLECTOMY      Allergies: Patient has no known allergies.  Medications: Prior to Admission medications   Medication Sig Start Date End Date Taking? Authorizing Provider  furosemide (LASIX) 20 MG tablet Take 1 tablet (20 mg total) by mouth 2 (two) times daily. 02/16/21 03/18/21  Han, Aimee H, PA-C  lactulose (CHRONULAC) 10 GM/15ML solution Take 30 mLs (20 g total) by mouth 3 (three) times daily. 01/29/21 01/24/22  Rhetta Mura, MD  midodrine (PROAMATINE) 10 MG tablet Take 1 tablet (10 mg total) by mouth 3 (three) times daily with meals. 01/29/21   Rhetta Mura, MD     Family History  Problem Relation Age of Onset   Liver disease Neg Hx      Social History   Socioeconomic History   Marital status: Divorced    Spouse name: Not on file   Number of children: Not on file   Years of education: Not on file   Highest education level: Not on file  Occupational History   Not on file  Tobacco Use   Smoking status: Every Day    Types: Cigarettes   Smokeless tobacco: Never  Substance and Sexual Activity   Alcohol use: Not Currently   Drug use: Never   Sexual activity: Not Currently  Other Topics Concern   Not on file  Social History Narrative   Not on file   Social Determinants of Health   Financial Resource Strain: Not on file  Food Insecurity: Not on file  Transportation Needs: Not on file  Physical Activity: Not on file  Stress: Not on file  Social Connections: Not on file     Vital Signs: There were no vitals taken for this visit.  No physical examination performed in lieu of telephone visit.  Imaging:  CT Abdomen 03/12/21 IMPRESSION: 1. Advanced cirrhotic changes involving the liver with portal venous hypertension, portal venous collaterals and splenorenal shunt. No worrisome hepatic lesions. 2. Patent TIPS shunt without complicating features. 3. Small to moderate volume ascites and diffuse body wall edema. 4. Cholelithiasis. 5. Bilateral renal calculi.   Labs:  CBC: Recent Labs  01/27/21 0418 01/28/21 0219 02/15/21 1255 03/11/21 1303  WBC 3.2* 4.4 3.8* 3.2*  HGB 12.5 13.0 12.3 11.6*  HCT 38.6 39.2 37.9 35.6  PLT 68* 59* 52* 56*    COAGS: Recent Labs    01/27/21 0418 01/28/21 0219 02/15/21 1255 03/11/21 1303  INR 1.2 1.2 1.5* 1.4*  APTT 31  --   --   --     BMP: Recent Labs    01/27/21 0418 01/28/21 0219 01/29/21 0011 02/15/21 1255 03/11/21 1303  NA 133* 134* 135 137 141  K 4.3 5.1 4.4 4.6 3.7  CL 95* 97* 99 106 105  CO2 31 27 29 24 31   GLUCOSE 94 131* 94 83 81  BUN 26* 24* 23 11 11   CALCIUM 8.9 9.1 9.1 9.1 9.1  CREATININE 1.65* 1.39* 1.60* 0.89 0.85  GFRNONAA  34* 41* 35* >60  --     LIVER FUNCTION TESTS: Recent Labs    01/27/21 0418 01/28/21 0219 01/29/21 0011 02/15/21 1255 03/11/21 1303  BILITOT 1.1 1.0 1.0 1.7* 2.2*  AST 31 42* 51* 78* 33  ALT 14 17 23  32 10  ALKPHOS 47 48 51 69  --   PROT 6.2* 6.2* 6.0* 6.7 6.3  ALBUMIN 2.9* 2.7* 2.8* 3.0*  --     Assessment and Plan: 70 year old female with history of portal hypertension 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   Recent labs demonstrate normalization in transaminases after TIPS and slight rise in total bilirubin to 2.2.  Creatinine has normalized from 1.65 pre-TIPS to 0.85, tolerating increased dose of lasix to 40 mg BID well.  She continues to third space some with small bilateral pleural effusions, small volume ascites, and anasarca on her recent CT in addition to subjective complaints of leg swelling.  Overall, she has had complete resolution of hepatorenal syndrome and marked decreased in ascites.  Hopeful for continued autodiuresis along with pharmacologic diuresis and resolution of edema in the next month or two.    She is to have a visit with Gastroenterology next month - appreciate assistance in her care.   -continue lasix to 40 mg BID -continue lactulose 30 mg TID -continue midorine for now -establish primary care -follow up in 1 month via telephone visit with repeat labs (CBC, CMP, INR)    Electronically Signed: 73 Nole Robey 03/17/2021, 8:29 AM   I spent a total of 25 Minutes in face to face in clinical consultation, greater than 50% of which was counseling/coordinating care for portal hypertension.

## 2021-03-24 ENCOUNTER — Other Ambulatory Visit: Payer: Self-pay | Admitting: Interventional Radiology

## 2021-03-24 ENCOUNTER — Other Ambulatory Visit: Payer: Self-pay

## 2021-03-24 DIAGNOSIS — R188 Other ascites: Secondary | ICD-10-CM

## 2021-03-24 DIAGNOSIS — K7460 Unspecified cirrhosis of liver: Secondary | ICD-10-CM

## 2021-03-30 ENCOUNTER — Other Ambulatory Visit: Payer: Self-pay

## 2021-04-05 ENCOUNTER — Encounter: Payer: Self-pay | Admitting: Gastroenterology

## 2021-04-05 ENCOUNTER — Other Ambulatory Visit: Payer: Self-pay

## 2021-04-05 ENCOUNTER — Ambulatory Visit (INDEPENDENT_AMBULATORY_CARE_PROVIDER_SITE_OTHER): Payer: Medicare Other | Admitting: Gastroenterology

## 2021-04-05 VITALS — BP 97/54 | HR 99 | Temp 97.8°F | Ht 64.0 in | Wt 216.2 lb

## 2021-04-05 DIAGNOSIS — K7460 Unspecified cirrhosis of liver: Secondary | ICD-10-CM | POA: Diagnosis not present

## 2021-04-05 DIAGNOSIS — R188 Other ascites: Secondary | ICD-10-CM

## 2021-04-05 NOTE — Progress Notes (Signed)
Primary Care Physician: Pcp, No  Primary Gastroenterologist:  Dr. Midge Minium  Chief Complaint  Patient presents with   New Patient (Initial Visit)    Follow up ER - Cirrhosis     HPI: Joan Roman is a 70 y.o. female here with a history of what sounds like nonalcoholic steatohepatitis and diagnosis of cirrhosis with ascites.  The patient had recurrent ascites and was sent for a TIPS procedure.  The patient was seen at [facility] for TIPS and was also seen by Dr. Christella Hartigan at that time. She was originally seen by Dr. Tobi Bastos with follow-up during her hospitalization by me.  At discharge from [facility] Dr. Christella Hartigan had suggested that the patient follow up as an outpatient for a workup for other possible causes of her liver cirrhosis and EGD for variceal screening. The patient reports that her abdominal swelling has gone down significantly while being put on the diuretics and she reports that it was increased from 20 mg twice a day to 40 mg twice a day.  She also reports that she is not having as much abdominal discomfort.  She still continues to have swelling of her lower extremities.  Past Medical History:  Diagnosis Date   Bronchitis     Current Outpatient Medications  Medication Sig Dispense Refill   furosemide (LASIX) 20 MG tablet Take 2 tablets (40 mg total) by mouth 2 (two) times daily. 120 tablet 3   lactulose (CHRONULAC) 10 GM/15ML solution Take 30 mLs (20 g total) by mouth 3 (three) times daily. 8100 mL 3   midodrine (PROAMATINE) 10 MG tablet Take 1 tablet (10 mg total) by mouth 3 (three) times daily with meals. 90 tablet 1   No current facility-administered medications for this visit.    Allergies as of 04/05/2021   (No Known Allergies)    ROS:  General: Negative for anorexia, weight loss, fever, chills, fatigue, weakness. ENT: Negative for hoarseness, difficulty swallowing , nasal congestion. CV: Negative for chest pain, angina, palpitations, dyspnea  on exertion, peripheral edema.  Respiratory: Negative for dyspnea at rest, dyspnea on exertion, cough, sputum, wheezing.  GI: See history of present illness. GU:  Negative for dysuria, hematuria, urinary incontinence, urinary frequency, nocturnal urination.  Endo: Negative for unusual weight change.    Physical Examination:   BP (!) 97/54 (BP Location: Left Arm, Patient Position: Sitting, Cuff Size: Large)   Pulse 99   Temp 97.8 F (36.6 C) (Temporal)   Ht 5\' 4"  (1.626 m)   Wt 216 lb 3.2 oz (98.1 kg)   BMI 37.11 kg/m   General: Well-nourished, well-developed in no acute distress.  Eyes: No icterus. Conjunctivae pink. Lungs: Clear to auscultation bilaterally. Non-labored. Heart: Regular rate and rhythm, no murmurs rubs or gallops.  Abdomen: Bowel sounds are normal, nontender, nondistended, no hepatosplenomegaly or masses, no abdominal bruits or hernia , no rebound or guarding.   Extremities: No lower extremity edema. No clubbing or deformities. Neuro: Alert and oriented x 3.  Grossly intact. Skin: Friable with ecchymosis throughout .   Psych: Alert and cooperative, normal mood and affect.  Labs:    Imaging Studies: CT ABDOMEN W WO CONTRAST  Result Date: 03/15/2021 CLINICAL DATA:  History of cirrhosis. Liver lesions seen on recent ultrasound examination. History of prior tips procedure. EXAM: CT ABDOMEN WITHOUT AND WITH CONTRAST TECHNIQUE: Multidetector CT imaging of the abdomen was performed following the standard protocol before and following the bolus administration of intravenous contrast. CONTRAST:  184mL OMNIPAQUE IOHEXOL 350 MG/ML SOLN COMPARISON:  01/12/2021 FINDINGS: Lower chest: No there are moderate-sized bilateral pleural effusions with overlying atelectasis. The heart is normal in size. No pericardial effusion. Scattered coronary artery calcifications are noted. The distal esophagus is grossly normal. No findings suspicious for esophageal varices. Hepatobiliary: Tips shunt  is in place and appears patent. Advanced cirrhotic changes involving the liver with portal venous hypertension and portal venous collaterals. No worrisome hepatic lesions are identified. Numerous calcified gallstones are noted the gallbladder. No common bile duct dilatation. Pancreas: No mass, inflammation or ductal dilatation. Spleen: Stable splenomegaly. Extensive perisplenic collaterals and splenorenal shunt. Adrenals/Urinary Tract: The adrenal glands are normal. Bilateral renal calculi. No worrisome renal lesions or hydronephrosis. Small renal cysts. Stomach/Bowel: Stomach, duodenum and small are unremarkable. No acute inflammatory process, mass lesions or obstructive findings. The visualized colon is grossly normal. Vascular/Lymphatic: The aorta and branch vessels are patent. The major venous structures are patent. Small scattered upper abdominal lymph nodes are stable and typical with cirrhosis. Other: Small to moderate volume ascites noted along with mesenteric and omental edema. There is also diffuse body wall edema. Musculoskeletal: No significant bony findings. IMPRESSION: 1. Advanced cirrhotic changes involving the liver with portal venous hypertension, portal venous collaterals and splenorenal shunt. No worrisome hepatic lesions. 2. Patent TIPS shunt without complicating features. 3. Small to moderate volume ascites and diffuse body wall edema. 4. Cholelithiasis. 5. Bilateral renal calculi. Electronically Signed   By: Marijo Sanes M.D.   On: 03/15/2021 15:32   IR Radiologist Eval & Mgmt  Result Date: 03/17/2021 Please refer to notes tab for details about interventional procedure. (Op Note)   Assessment and Plan:   Joan Roman is a 70 y.o. y/o female who comes in today with a history of cirrhosis.  The patient has been told that she should have lab sent off for other possible causes of her cirrhosis besides fatty liver disease.  The patient will also be checked to see if she has immunity to  hepatitis A and hepatitis B and will be vaccinated accordingly.  The patient will also be set up for an upper endoscopy for screening for esophageal varices.  If the patient's creatinine is stable then she may be increased on her diuretics or adding Aldactone for her lower extremity edema.  The patient has been explained the plan and agrees with it.     Lucilla Lame, MD. Marval Regal    Note: This dictation was prepared with Dragon dictation along with smaller phrase technology. Any transcriptional errors that result from this process are unintentional.

## 2021-04-21 ENCOUNTER — Ambulatory Visit
Admission: RE | Admit: 2021-04-21 | Discharge: 2021-04-21 | Disposition: A | Discharge status: Home / Self Care | Payer: Medicare Other | Source: Ambulatory Visit | Attending: Interventional Radiology | Admitting: Interventional Radiology

## 2021-04-21 ENCOUNTER — Encounter: Payer: Self-pay | Admitting: *Deleted

## 2021-04-21 ENCOUNTER — Other Ambulatory Visit: Payer: Self-pay

## 2021-04-21 DIAGNOSIS — R188 Other ascites: Secondary | ICD-10-CM

## 2021-04-21 DIAGNOSIS — K7460 Unspecified cirrhosis of liver: Secondary | ICD-10-CM

## 2021-04-21 HISTORY — PX: IR RADIOLOGIST EVAL & MGMT: IMG5224

## 2021-04-21 NOTE — Progress Notes (Signed)
Reason for follow up: Telephone visit status post TIPS (01/27/21), follow up fluid status  History of present illness: [name] continues to complain of lower extremity swelling with some weeping.  She has remained on lasix 40 mg BID.  She has run out of midodrine.  Denies encephalopathy.  Some intermittent mild abdominal pain.  Endorses mild abdominal swelling, but does not feel like she has ascites.  No chest pain or shortness of breath.    She recently established Gastroenterology care with Joan Roman.  He has ordered labs and is likely going to perform EGD soon.  She has also finally been able to establish primary care with Joan Roman with Joan Roman.     Past Medical History:  Diagnosis Date   Bronchitis     Past Surgical History:  Procedure Laterality Date   IR RADIOLOGIST EVAL & MGMT  02/23/2021   IR RADIOLOGIST EVAL & MGMT  03/17/2021   IR TIPS  01/27/2021   RADIOLOGY WITH ANESTHESIA N/A 01/27/2021   Procedure: IR WITH ANESTHESIA - TIPS;  Surgeon: Joan Dallas, MD;  Location: [facility] OR;  Service: Radiology;  Laterality: N/A;   TONSILLECTOMY      Allergies: Patient has no known allergies.  Medications: Prior to Admission medications   Medication Sig Start Date End Date Taking? Authorizing Provider  furosemide (LASIX) 20 MG tablet Take 2 tablets (40 mg total) by mouth 2 (two) times daily. 03/17/21   Roman, Joan H, PA-C  lactulose (CHRONULAC) 10 GM/15ML solution Take 30 mLs (20 g total) by mouth 3 (three) times daily. 01/29/21 01/24/22  Joan Mura, MD  midodrine (PROAMATINE) 10 MG tablet Take 1 tablet (10 mg total) by mouth 3 (three) times daily with meals. 01/29/21   Joan Mura, MD     Family History  Problem Relation Age of Onset   Liver disease Neg Hx     Social History   Socioeconomic History   Marital status: Divorced    Spouse name: Not on file   Number of children: Not on file   Years of education: Not on file   Highest education level: Not on  file  Occupational History   Not on file  Tobacco Use   Smoking status: Every Day    Types: Cigarettes   Smokeless tobacco: Never  Substance and Sexual Activity   Alcohol use: Not Currently   Drug use: Never   Sexual activity: Not Currently  Other Topics Concern   Not on file  Social History Narrative   Not on file   Social Determinants of Health   Financial Resource Strain: Not on file  Food Insecurity: Not on file  Transportation Needs: Not on file  Physical Activity: Not on file  Stress: Not on file  Social Connections: Not on file     Vital Signs: There were no vitals taken for this visit.  No physical examination performed in lieu of telephone visit.  Imaging: No results found.  Labs: Joan Roman has yet to obtain labs ordered at last visit.   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  She continues to have third spacing of fluid in her lower extremities and possibly her abdomen without definite ascites recurrence.    Appreciate excellent longitudinal care from Drs. Servando Snare and [place].   -follow  up MELD + additional GI labs ordered by Dr. Servando Snare -agree with starting aldactone given persistent edema -if abd distention continues, will obtain ultrasound to assess for ascites/TIPS patency -follow up in 2 months in IR clinic.   Electronically Signed: Bennie Roman 04/21/2021, 12:56 PM   I spent a total of 25 Minutes in face to face in clinical consultation, greater than 50% of which was counseling/coordinating care for portal hypertension.

## 2021-05-18 ENCOUNTER — Other Ambulatory Visit: Payer: Self-pay | Admitting: Interventional Radiology

## 2021-05-18 DIAGNOSIS — K7460 Unspecified cirrhosis of liver: Secondary | ICD-10-CM

## 2021-06-19 ENCOUNTER — Telehealth: Payer: Self-pay | Admitting: Student

## 2021-06-19 NOTE — Telephone Encounter (Signed)
Patient called IR to let us know that she has been in a rehab facility since before Christmas. She is hoping to be discharged home this week and will call the clinic to set up an appointment for TIPS follow up with Dr. Elby Showers. She has the number to the clinic.  Alwyn Ren, [place] [phone] 06/19/2021, 11:23 AM

## 2021-07-05 ENCOUNTER — Other Ambulatory Visit: Payer: Self-pay | Admitting: Interventional Radiology

## 2021-07-05 DIAGNOSIS — K7460 Unspecified cirrhosis of liver: Secondary | ICD-10-CM

## 2021-07-22 ENCOUNTER — Encounter: Payer: Self-pay | Admitting: *Deleted

## 2021-07-22 ENCOUNTER — Ambulatory Visit
Admission: RE | Admit: 2021-07-22 | Discharge: 2021-07-22 | Disposition: A | Discharge status: Home / Self Care | Payer: Medicare Other | Source: Ambulatory Visit | Attending: Interventional Radiology | Admitting: Interventional Radiology

## 2021-07-22 ENCOUNTER — Other Ambulatory Visit: Payer: Self-pay

## 2021-07-22 DIAGNOSIS — K7460 Unspecified cirrhosis of liver: Secondary | ICD-10-CM

## 2021-07-22 HISTORY — PX: IR RADIOLOGIST EVAL & MGMT: IMG5224

## 2021-07-22 NOTE — Progress Notes (Signed)
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   At her last visit on 04/21/21, she continued to have third spacing of fluid in her lower extremities and possibly her abdomen without definite ascites recurrence.  Since then, she was admitted to a rehabilitation clinic in [place], [place] due to fluid overload in her lower extremities.  She was there nearly a month, and experienced significant improvements in her overall fluid status and strength.  She now has almost no edema in her lower extremities.  No evidence of ascites recurrence.  She is now compliant with TID lactulose, lasix 40 mg BID, and spironolactone 100 mg.  She endorses some forgetfulness (present prior to TIPS), and states that her family sometimes mentions she has mild confusion, but denies any outright hepatic encephalopathy.   Past Medical History:  Diagnosis Date   Bronchitis     Past Surgical History:  Procedure Laterality Date   IR RADIOLOGIST EVAL & MGMT  02/23/2021   IR RADIOLOGIST EVAL & MGMT  03/17/2021   IR RADIOLOGIST EVAL & MGMT  04/21/2021   IR TIPS  01/27/2021   RADIOLOGY WITH ANESTHESIA N/A 01/27/2021   Procedure: IR WITH ANESTHESIA - TIPS;  Surgeon: Bennie Dallas, MD;  Location: [facility] OR;  Service: Radiology;  Laterality: N/A;   TONSILLECTOMY      Allergies: Patient has no known allergies.  Medications: Prior to Admission medications   Medication Sig Start Date End Date Taking? Authorizing Provider  furosemide (LASIX) 20 MG tablet Take 2 tablets (40 mg total) by mouth 2 (two) times daily. 03/17/21   Han, Aimee H, PA-C  lactulose (CHRONULAC) 10 GM/15ML solution Take 30 mLs (20 g total) by mouth 3 (three) times daily. 01/29/21  01/24/22  Rhetta Mura, MD  midodrine (PROAMATINE) 10 MG tablet Take 1 tablet (10 mg total) by mouth 3 (three) times daily with meals. 01/29/21   Rhetta Mura, MD     Family History  Problem Relation Age of Onset   Liver disease Neg Hx     Social History   Socioeconomic History   Marital status: Divorced    Spouse name: Not on file   Number of children: Not on file   Years of education: Not on file   Highest education level: Not on file  Occupational History   Not on file  Tobacco Use   Smoking status: Every Day    Types: Cigarettes   Smokeless tobacco: Never  Substance and Sexual Activity   Alcohol use: Not Currently   Drug use: Never   Sexual activity: Not Currently  Other Topics Concern   Not on file  Social History Narrative   Not on file   Social Determinants of Health   Financial Resource Strain: Not on file  Food Insecurity: Not on file  Transportation Needs: Not on file  Physical Activity: Not on file  Stress: Not on file  Social Connections: Not on file     Vital Signs: There were no vitals taken for this visit.  Physical Exam Constitutional:      General: She is not in acute distress.    Appearance: She is normal weight.  HENT:     Head: Normocephalic.  Mouth/Throat:     Mouth: Mucous membranes are moist.  Eyes:     General: No scleral icterus. Cardiovascular:     Rate and Rhythm: Normal rate and regular rhythm.  Pulmonary:     Effort: Pulmonary effort is normal.  Abdominal:     General: There is no distension.  Musculoskeletal:     Right lower leg: No edema.     Left lower leg: No edema.  Skin:    General: Skin is warm and dry.     Coloration: Skin is not jaundiced.  Neurological:     Mental Status: She is oriented to person, place, and time.    Imaging: US ABDOMINAL PELVIC ART/VENT FLOW DOPPLER  Result Date: 07/22/2021 CLINICAL DATA:  71 year old female status post TIPS creation in the setting of recurrent ascites.  EXAM: DUPLEX ULTRASOUND OF LIVER AND TIPS SHUNT TECHNIQUE: Color and duplex Doppler ultrasound was performed to evaluate the hepatic in-flow and out-flow vessels. COMPARISON:  02/23/2021 FINDINGS: Portal Vein Velocities (all antegrade) Main:  60 cm/sec Right:  19 cm/sec Left:  9 cm/sec TIPS Stent Velocities Proximal:  42 cm/sec Mid:  78 Distal:  81 cm/sec IVC: Present and patent with normal respiratory phasicity. Hepatic Vein Velocities Right:  13 cm/sec Mid:  15 cm/sec Left:  14 cm/sec Splenic Vein: Patent. Superior Mesenteric Vein: Patent. Hepatic Artery: Patent with normal flow velocity and waveform. Ascities: Absent. Varices: Absent. Other findings: Cholelithiasis is noted without gallbladder wall thickening or pericholecystic fluid. IMPRESSION: 1. Patent TIPS stent. 2. Cholelithiasis. Marliss Coots, MD [facility] Electronically Signed   By: Marliss Coots M.D.   On: 07/22/2021 10:53    Labs:  CBC: Recent Labs    01/27/21 0418 01/28/21 0219 02/15/21 1255 03/11/21 1303  WBC 3.2* 4.4 3.8* 3.2*  HGB 12.5 13.0 12.3 11.6*  HCT 38.6 39.2 37.9 35.6  PLT 68* 59* 52* 56*    COAGS: Recent Labs    01/27/21 0418 01/28/21 0219 02/15/21 1255 03/11/21 1303  INR 1.2 1.2 1.5* 1.4*  APTT 31  --   --   --     BMP: Recent Labs    01/27/21 0418 01/28/21 0219 01/29/21 0011 02/15/21 1255 03/11/21 1303  NA 133* 134* 135 137 141  K 4.3 5.1 4.4 4.6 3.7  CL 95* 97* 99 106 105  CO2 31 27 29 24 31   GLUCOSE 94 131* 94 83 81  BUN 26* 24* 23 11 11   CALCIUM 8.9 9.1 9.1 9.1 9.1  CREATININE 1.65* 1.39* 1.60* 0.89 0.85  GFRNONAA 34* 41* 35* >60  --     LIVER FUNCTION TESTS: Recent Labs    01/27/21 0418 01/28/21 0219 01/29/21 0011 02/15/21 1255 03/11/21 1303  BILITOT 1.1 1.0 1.0 1.7* 2.2*  AST 31 42* 51* 78* 33  ALT 14 17 23  32 10  ALKPHOS 47 48 51 69  --   PROT 6.2* 6.2* 6.0* 6.7 6.3  ALBUMIN 2.9* 2.7* 2.8* 3.0*  --      Assessment and Plan: 71 year old female with history of portal hypertension 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  Fluid status has significantly improved over the past 3 months.  No encephalopathy.  -agree with continuing current diuretic regimen, would not object to slowly weaning as tolerated, at discretion of Dr. 78 (PCP) -encouraged to follow up with Dr.  Wohl (Gastroenterology) -follow up in 3 months in IR clinic to assess progress (no imaging needed at that time)   Electronically Signed: Thressa Sheller Koltan Portocarrero 07/22/2021, 10:57 AM   I spent a total of 40 Minutes in face to face in clinical consultation, greater than 50% of which was counseling/coordinating care for portal hypertension.

## 2021-09-15 ENCOUNTER — Other Ambulatory Visit: Payer: Self-pay | Admitting: Interventional Radiology

## 2021-09-15 DIAGNOSIS — K7460 Unspecified cirrhosis of liver: Secondary | ICD-10-CM

## 2021-09-28 ENCOUNTER — Encounter: Payer: Self-pay | Admitting: Gastroenterology

## 2021-09-28 ENCOUNTER — Ambulatory Visit (INDEPENDENT_AMBULATORY_CARE_PROVIDER_SITE_OTHER): Payer: Medicare Other | Admitting: Gastroenterology

## 2021-09-28 VITALS — BP 105/69 | HR 74 | Temp 97.8°F | Wt 204.0 lb

## 2021-09-28 DIAGNOSIS — K7460 Unspecified cirrhosis of liver: Secondary | ICD-10-CM | POA: Diagnosis not present

## 2021-09-28 DIAGNOSIS — R188 Other ascites: Secondary | ICD-10-CM | POA: Diagnosis not present

## 2021-09-28 NOTE — Progress Notes (Signed)
? ? ?Primary Care Physician: Pcp, No ? ?Primary Gastroenterologist:  Dr. Lucilla Lame ? ?Chief Complaint  ?Patient presents with  ? Follow-up  ? ? ?HPI: Joan Roman is a 71 y.o. female here with a history of cirrhosis and for follow-up at the present time. The patient has had a TIPS procedure.  She was seen by me back in November and labs were ordered and she was recommended to undergo an upper endoscopy to rule out varices.  The patient has had none of these and is not really forthcoming with the reason why she hasn't had these done.  She states that her doctor at [facility] in [place] had done some lab work but she is not sure what they did normal with the results are.  When asked about imaging the patient states that she has been following up with interventional radiology about her TIPS and believes they were taking care of imaging.  She has recently had her Lasix decreased because of worsening of her renal failure that appears to have come back to normal. ? ?Past Medical History:  ?Diagnosis Date  ? Bronchitis   ? ? ?Current Outpatient Medications  ?Medication Sig Dispense Refill  ? furosemide (LASIX) 20 MG tablet Take 2 tablets (40 mg total) by mouth 2 (two) times daily. 120 tablet 3  ? lactulose (CHRONULAC) 10 GM/15ML solution Take 30 mLs (20 g total) by mouth 3 (three) times daily. 8100 mL 3  ? midodrine (PROAMATINE) 10 MG tablet Take 1 tablet (10 mg total) by mouth 3 (three) times daily with meals. 90 tablet 1  ? ?No current facility-administered medications for this visit.  ? ? ?Allergies as of 09/28/2021  ? (No Known Allergies)  ? ? ?ROS: ? ?General: Negative for anorexia, weight loss, fever, chills, fatigue, weakness. ?ENT: Negative for hoarseness, difficulty swallowing , nasal congestion. ?CV: Negative for chest pain, angina, palpitations, dyspnea on exertion, peripheral edema.  ?Respiratory: Negative for dyspnea at rest, dyspnea on exertion, cough, sputum, wheezing.  ?GI: See history of  present illness. ?GU:  Negative for dysuria, hematuria, urinary incontinence, urinary frequency, nocturnal urination.  ?Endo: Negative for unusual weight change.  ?  ?Physical Examination: ? ? BP 105/69   Pulse 74   Temp 97.8 ?F (36.6 ?C) (Oral)   Wt 204 lb (92.5 kg)   BMI 35.02 kg/m?  ? ?General: Well-nourished, well-developed in no acute distress.  ?Eyes: No icterus. Conjunctivae pink. ?Neuro: Alert and oriented x 3.  Grossly intact. ?Skin: Warm and dry, no jaundice.   ?Psych: Alert and cooperative, normal mood and affect. ? ?Labs:  ?  ?Imaging Studies: ?No results found. ? ?Assessment and Plan:  ? ?Joan Roman is a 71 y.o. y/o female who comes in today for follow-up of cirrhosis.  The patient was asked multiple times that she was willing to proceed with further workup for possible cause of her abnormal liver enzymes since she did not have any other previous workup completed the last time I saw her. She had discussed many reasons why she did not want to proceed with any workup including that she has early had blood work done although no results of those are available to me, that she noted he had imaging by radiology for TIPS and they should've seen would need to be seen and she does not want to have any more injections or needles which may be required if she needs vaccinations for hepatitis A and B if deemed not immune.  The  patient was given multiple opportunities to consent for further workup but was hesitant therefore I had told the patient that I will try to obtain the labs that were already done by her primary care provider.  I have also told patient that if there are any labs lacking she will be notified by my office which labs we would like her to get.  She has also been told the risks of progression of her cirrhosis and risk of hepatocellular carcinoma Surveillance.  She states she understands these and would like to continue with things are going now and not proceed with any further  testing. ? ? ? ? ?Lucilla Lame, MD. [facility]al ? ? ? Note: This dictation was prepared with Dragon dictation along with smaller phrase technology. Any transcriptional errors that result from this process are unintentional.  ?

## 2021-11-17 ENCOUNTER — Ambulatory Visit
Admission: RE | Admit: 2021-11-17 | Discharge: 2021-11-17 | Disposition: A | Discharge status: Home / Self Care | Payer: Medicare Other | Source: Ambulatory Visit | Attending: Internal Medicine | Admitting: Internal Medicine

## 2021-11-17 ENCOUNTER — Other Ambulatory Visit: Payer: Self-pay | Admitting: Internal Medicine

## 2021-11-17 DIAGNOSIS — K7469 Other cirrhosis of liver: Secondary | ICD-10-CM

## 2021-11-17 DIAGNOSIS — M79672 Pain in left foot: Secondary | ICD-10-CM

## 2023-03-31 DEATH — deceased

## 2023-07-05 IMAGING — CT CT ABD-PELV W/O CM
2 of 7 series · 16 of 46 positions shown, 18 images · non-contrast
Comparison: None.

CLINICAL DATA: Abdominal distension

EXAM:
CT ABDOMEN AND PELVIS WITHOUT CONTRAST
TECHNIQUE: Multidetector CT imaging of the abdomen and pelvis was performed
following the standard protocol without IV contrast.

[Series 3: routine abd/pel wo · axial · 0.98mm/px · z∈[-740,-255]mm · 13 of 115 slices shown, 15 images]
[im 9/115  soft-tissue]
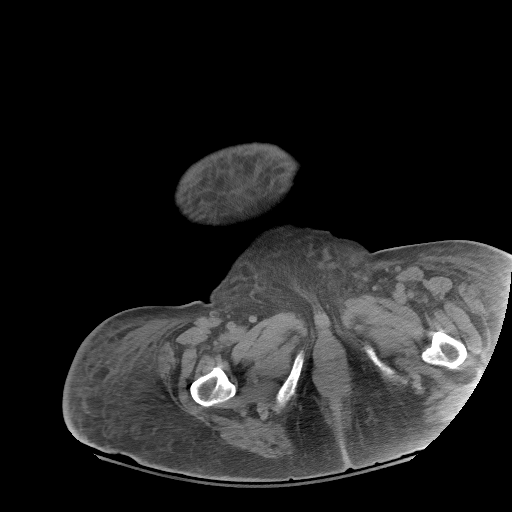
[im 9/115  bone]
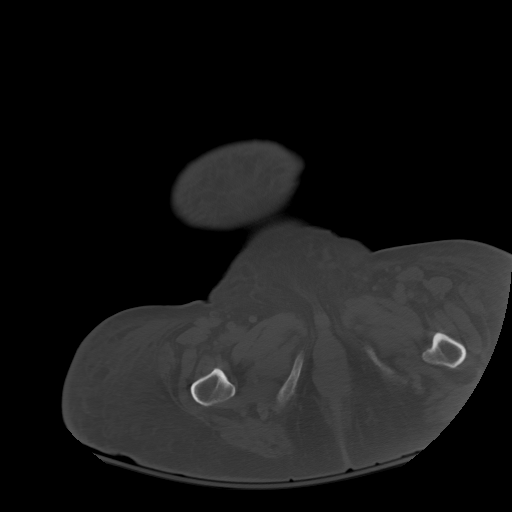
[im 17/115  soft-tissue]
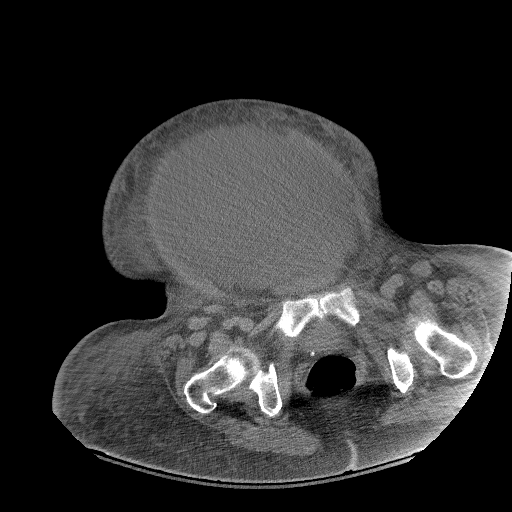
[im 25/115  soft-tissue]
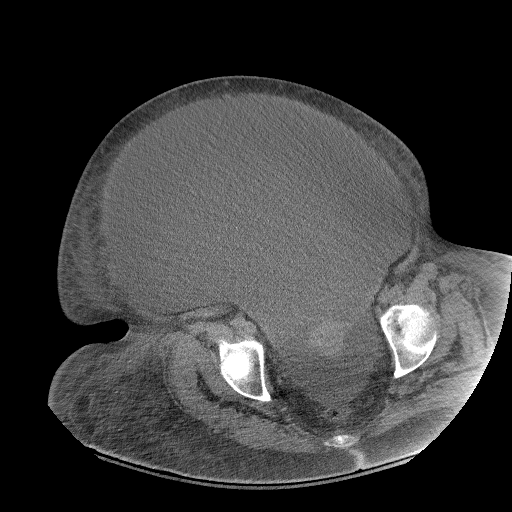
[im 33/115  soft-tissue]
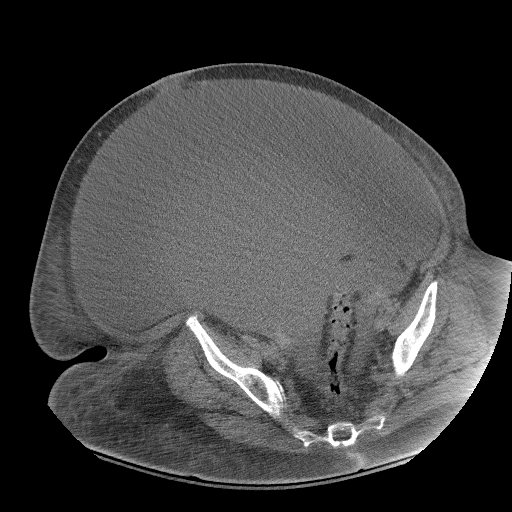
[im 41/115  soft-tissue]
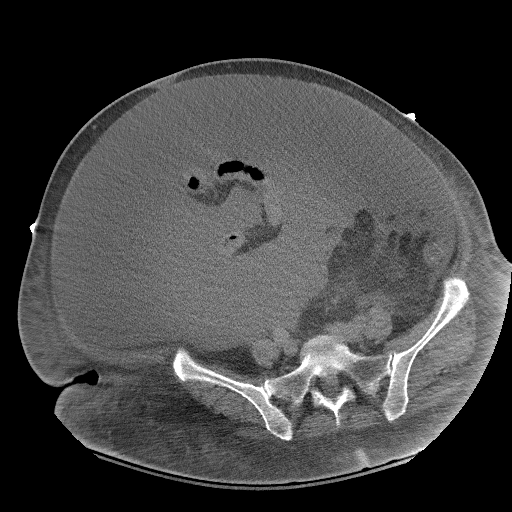
[im 49/115  soft-tissue]
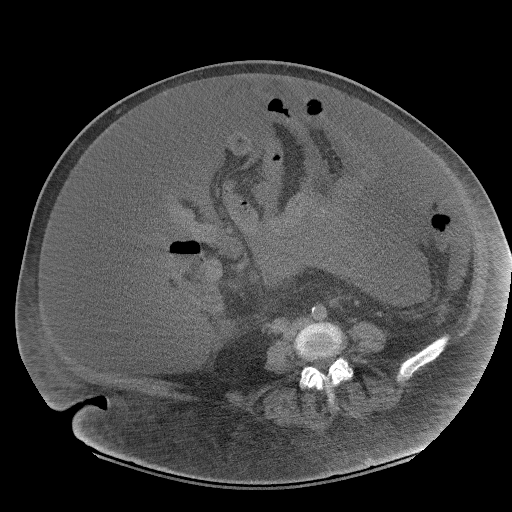
[im 58/115  soft-tissue]
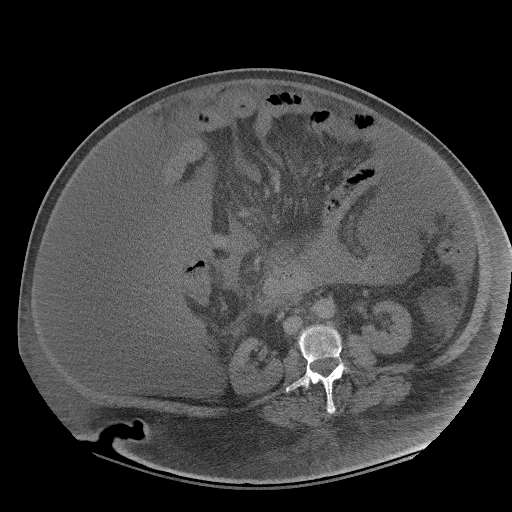
[im 66/115  soft-tissue]
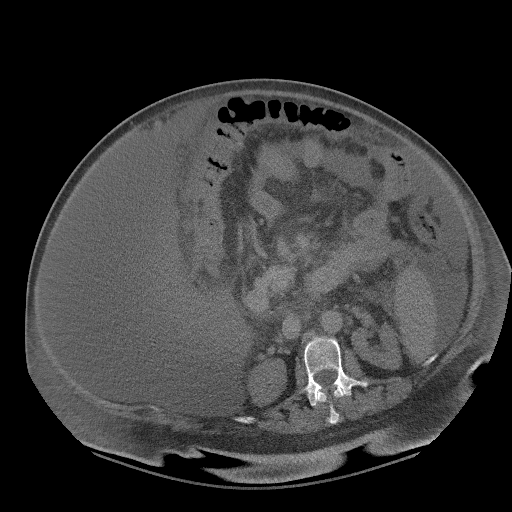
[im 74/115  soft-tissue]
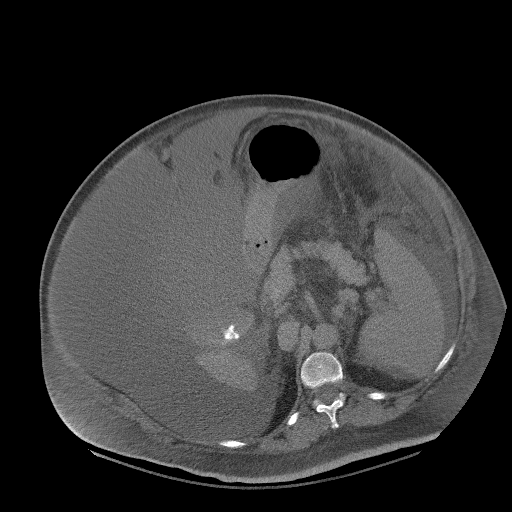
[im 74/115  bone]
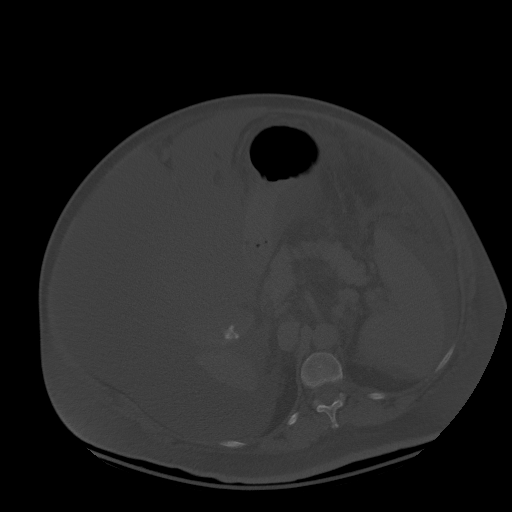
[im 82/115  soft-tissue]
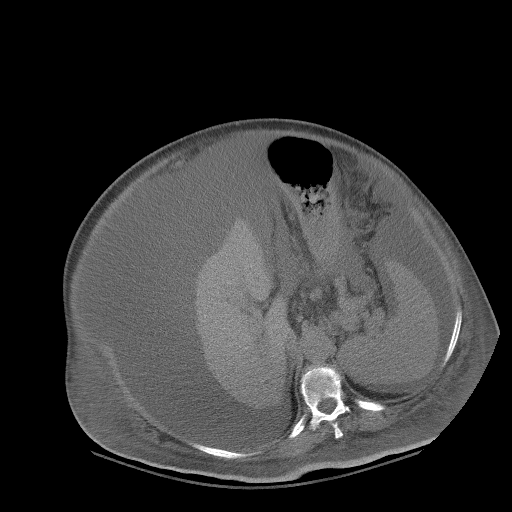
[im 90/115  soft-tissue]
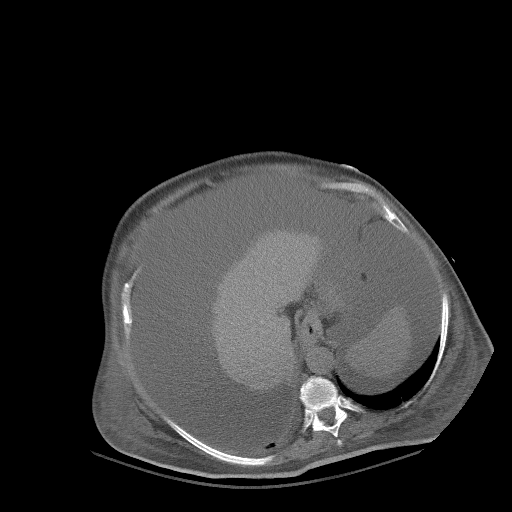
[im 98/115  soft-tissue]
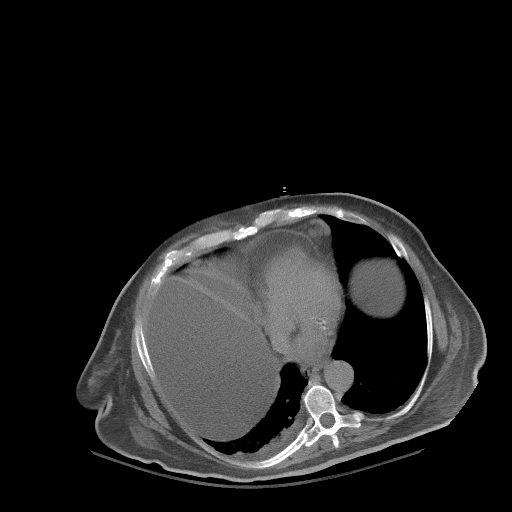
[im 106/115  soft-tissue]
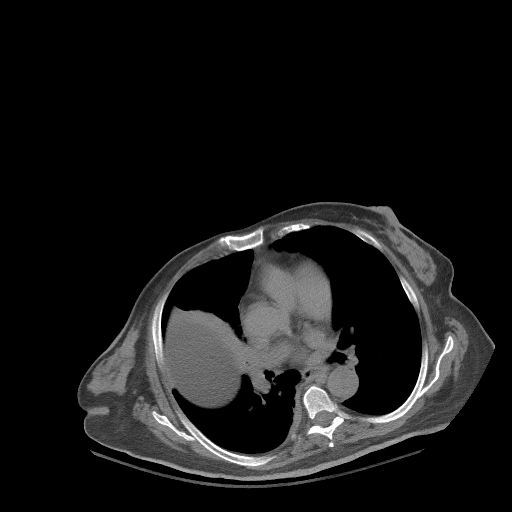

[Series 9: coronal st · coronal · 1.02mm/px · 3 of 152 slices shown]
[im 38/152  soft-tissue]
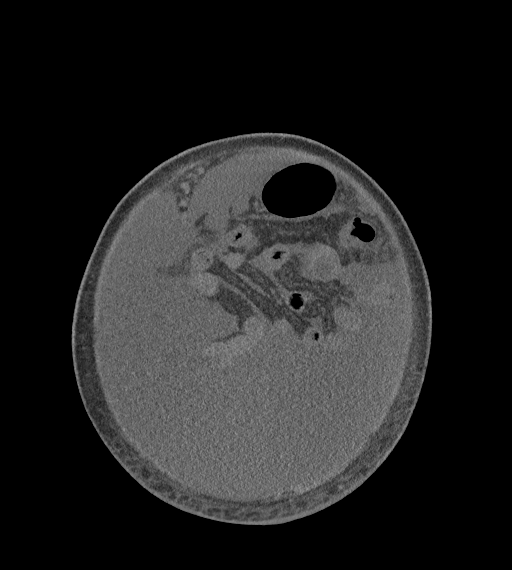
[im 76/152  soft-tissue]
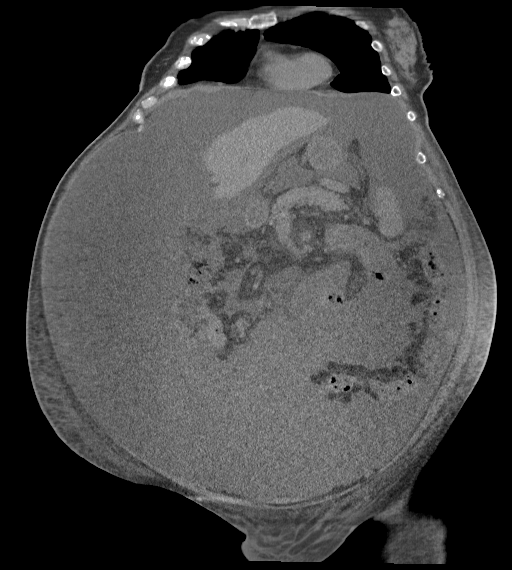
[im 114/152  soft-tissue]
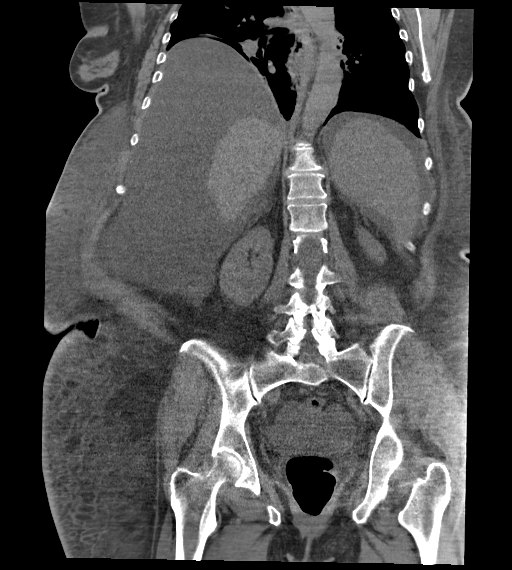

[16 of 46 positions shown; findings below may reference images not displayed]

FINDINGS: Lower chest: Lung bases demonstrate small right-sided pleural
effusion. Atelectasis at the right base. Normal cardiac size. Small
cardio phrenic lymph nodes. Small distal esophageal hiatal hernia.

Hepatobiliary: Cirrhotic morphology of the liver. Multiple calcified
gallstones. Subcentimeter hypodensity within the liver too small to
further characterize. No biliary dilatation. Recanalized
paraumbilical vein.

Pancreas: Unremarkable. No pancreatic ductal dilatation or
surrounding inflammatory changes.

Spleen: Enlarged measuring 15.5 cm craniocaudad.

Adrenals/Urinary Tract: Adrenal glands are normal. Multiple
bilateral kidney stones without hydronephrosis. The bladder is
difficult to see due to ascites.

Stomach/Bowel: Centralization of the bowel. No bowel distension. No
acute bowel wall thickening. Negative appendix.

Vascular/Lymphatic: Mild aortic atherosclerosis. No aneurysm. No
suspicious nodes. Tortuous portosystemic collaterals in the left
upper quadrant.

Reproductive: Uterus and bilateral adnexa are unremarkable.

Other: Large volume of abdominopelvic ascites.  No free air.

Musculoskeletal: No acute or suspicious osseous abnormality
IMPRESSION: 1. Liver cirrhosis with portal hypertension. Large quantity of
abdominopelvic ascites.
2. No evidence for bowel obstruction
3. Trace right-sided pleural effusion
4. Gallstones
5. Nonobstructing kidney stones
